# Patient Record
Sex: Male | Born: 1980 | Race: White | Hispanic: No | Marital: Married | Smoking: Never smoker
Health system: Southern US, Community
[De-identification: ages and names within clinical notes are randomized; demographics above are authoritative.]

## PROBLEM LIST (undated history)

## (undated) DIAGNOSIS — Z789 Other specified health status: Secondary | ICD-10-CM

## (undated) DIAGNOSIS — I861 Scrotal varices: Secondary | ICD-10-CM

## (undated) HISTORY — DX: Scrotal varices: I86.1

## (undated) HISTORY — DX: Other specified health status: Z78.9

---
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
## 2020-08-26 ENCOUNTER — Encounter: Payer: Self-pay | Admitting: Family Medicine

## 2020-08-26 ENCOUNTER — Ambulatory Visit (INDEPENDENT_AMBULATORY_CARE_PROVIDER_SITE_OTHER): Payer: Managed Care, Other (non HMO) | Admitting: Family Medicine

## 2020-08-26 ENCOUNTER — Other Ambulatory Visit: Payer: Self-pay

## 2020-08-26 VITALS — BP 108/73 | HR 50 | Temp 98.0°F | Ht 70.0 in | Wt 199.0 lb

## 2020-08-26 DIAGNOSIS — Z1159 Encounter for screening for other viral diseases: Secondary | ICD-10-CM | POA: Diagnosis not present

## 2020-08-26 DIAGNOSIS — E669 Obesity, unspecified: Secondary | ICD-10-CM | POA: Insufficient documentation

## 2020-08-26 DIAGNOSIS — Z23 Encounter for immunization: Secondary | ICD-10-CM

## 2020-08-26 DIAGNOSIS — H43393 Other vitreous opacities, bilateral: Secondary | ICD-10-CM

## 2020-08-26 DIAGNOSIS — Z114 Encounter for screening for human immunodeficiency virus [HIV]: Secondary | ICD-10-CM | POA: Diagnosis not present

## 2020-08-26 DIAGNOSIS — E663 Overweight: Secondary | ICD-10-CM

## 2020-08-26 DIAGNOSIS — L2084 Intrinsic (allergic) eczema: Secondary | ICD-10-CM | POA: Diagnosis not present

## 2020-08-26 DIAGNOSIS — M79671 Pain in right foot: Secondary | ICD-10-CM

## 2020-08-26 NOTE — Progress Notes (Signed)
New patient visit   Patient: Lance Bennett   DOB: 02/13/1981   39 y.o. Male  MRN: [id number] Visit Date: 08/26/2020  Today's healthcare provider: Shirlee Latch, MD   Chief Complaint  Patient presents with  . Rash  . Establish Care  . Foot Pain    Right foot a few days ago.  No injury.  Pt states it has resolved.   Subjective    Lance Bennett is a 39 y.o. male who presents today as a new patient to establish care.  HPI HPI    Foot Pain     Additional comments: Right foot a few days ago.  No injury.  Pt states it has resolved.       Last edited by Paschal Dopp, CMA on 08/26/2020  1:16 PM. (History)      Rash - intermittent x18 years. Itchy, always on R ankle.  Tried selsun blue.   Foot hurt for a few days and then self-resolved.  18 yrs ago, fell at work and ankle was crushed by a pipe. Electric pain. No swelling. Base of metatarsals. Pressure from shoes helped.  Floaters in vision - seem to have been there all his life.  No eye exam in 7 years. Couldn't have dilated eye exam.   Past Medical History:  Diagnosis Date  . No pertinent past medical history    Past Surgical History:  Procedure Laterality Date  . TONSILLECTOMY AND ADENOIDECTOMY  1996   Family Status  Relation Name Status  . Mother  Alive  . Father  Alive  . Sister  Alive  . Brother  Alive  . MGM  Alive  . MGF  Deceased  . PGM  Deceased  . PGF  Deceased  . Neg Hx  (Not Specified)   Family History  Problem Relation Age of Onset  . Diabetes Mother        diet-controlled  . Healthy Father   . Healthy Sister   . Heart defect Brother   . Diabetes Maternal Grandmother   . Breast cancer Maternal Grandmother   . Stroke Maternal Grandfather   . Diabetes Maternal Grandfather   . Transient ischemic attack Maternal Grandfather   . Prostate cancer Neg Hx   . Colon cancer Neg Hx    Social History   Socioeconomic History  . Marital status: Married    Spouse name: Not on file  . Number of  children: 3  . Years of education: Not on file  . Highest education level: Not on file  Occupational History  . 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 Smoking status: Never Smoker  . Smokeless tobacco: Never Used  Vaping Use  . Vaping Use: Never used  Substance and Sexual Activity  . Alcohol use: Yes    Comment: Rarely, social  . Drug use: Never  . Sexual activity: Yes    Partners: Female  Other Topics Concern  . Not on file  Social History Narrative  . Not on file   Social Determinants of Health   Financial Resource Strain:   . Difficulty of Paying Living Expenses: Not on file  Food Insecurity:   . Worried About Programme researcher, broadcasting/film/video in the Last Year: Not on file  . Ran Out of Food in the Last Year: Not on file  Transportation Needs:   . Lack of Transportation (Medical): Not on file  . Lack of Transportation (Non-Medical): Not on file  Physical Activity:   . Days of Exercise per Week: Not on file  . Minutes of Exercise per Session: Not on file  Stress:   . Feeling of Stress : Not on file  Social Connections:   . Frequency of Communication with Friends and Family: Not on file  . Frequency of Social Gatherings with Friends and Family: Not on file  . Attends Religious Services: Not on file  . Active Member of Clubs or Organizations: Not on file  . Attends BankerClub or Organization Meetings: Not on file  . Marital Status: Not on file   No outpatient medications prior to visit.   No facility-administered medications prior to visit.   No Known Allergies  Immunization History  Administered Date(s) Administered  . Influenza,inj,Quad PF,6+ Mos 08/26/2020    Health Maintenance  Topic Date Due  . COVID-19 Vaccine (1) Never done  . TETANUS/TDAP  Never done  . INFLUENZA VACCINE  Completed  . Hepatitis C Screening  Completed  . HIV Screening  Completed    Patient Care Team: Erasmo DownerBacigalupo, Kayon Dozier M, MD as PCP - General (Family  Medicine)  Review of Systems  Constitutional: Negative.   HENT: Negative.   Eyes: Negative.   Respiratory: Negative.   Cardiovascular: Negative.   Gastrointestinal: Negative.   Endocrine: Negative.   Genitourinary: Negative.   Musculoskeletal: Negative.   Skin: Negative.   Allergic/Immunologic: Negative.   Neurological: Negative.   Hematological: Negative.   Psychiatric/Behavioral: Negative.       Objective    BP 108/73 (BP Location: Right Arm, Patient Position: Sitting, Cuff Size: Large)   Pulse (!) 50   Temp 98 F (36.7 C) (Oral)   Ht 5\' 10"  (1.778 m)   Wt 199 lb (90.3 kg)   BMI 28.55 kg/m  Physical Exam Vitals reviewed.  Constitutional:      General: He is not in acute distress.    Appearance: Normal appearance. He is well-developed. He is not diaphoretic.  HENT:     Head: Normocephalic and atraumatic.     Right Ear: Tympanic membrane, ear canal and external ear normal.     Left Ear: Tympanic membrane, ear canal and external ear normal.  Eyes:     General: No scleral icterus.    Conjunctiva/sclera: Conjunctivae normal.     Pupils: Pupils are equal, round, and reactive to light.  Neck:     Thyroid: No thyromegaly.  Cardiovascular:     Rate and Rhythm: Normal rate and regular rhythm.     Pulses: Normal pulses.     Heart sounds: Normal heart sounds. No murmur heard.   Pulmonary:     Effort: Pulmonary effort is normal. No respiratory distress.     Breath sounds: Normal breath sounds. No wheezing or rales.  Abdominal:     General: There is no distension.     Palpations: Abdomen is soft.     Tenderness: There is no abdominal tenderness.  Musculoskeletal:        General: No swelling, tenderness or deformity.     Cervical back: Neck supple.     Right lower leg: No edema.     Left lower leg: No edema.  Lymphadenopathy:     Cervical: No cervical adenopathy.  Skin:    General: Skin is warm and dry.     Findings: Rash (small patch of eczema on posterior R  calf) present.  Neurological:     Mental Status: He is alert and oriented to person, place, and time. Mental  status is at baseline.     Gait: Gait normal.  Psychiatric:        Mood and Affect: Mood normal.        Behavior: Behavior normal.        Thought Content: Thought content normal.     Depression Screen PHQ 2/9 Scores 08/27/2020  PHQ - 2 Score 0   Results for orders placed or performed in visit on 08/26/20  Lipid panel  Result Value Ref Range   Cholesterol, Total 141 100 - 199 mg/dL   Triglycerides 161 0 - 149 mg/dL   HDL 28 (L) >09 mg/dL   VLDL Cholesterol Cal 23 5 - 40 mg/dL   LDL Chol Calc (NIH) 90 0 - 99 mg/dL   Chol/HDL Ratio 5.0 0.0 - 5.0 ratio  Comprehensive metabolic panel  Result Value Ref Range   Glucose 95 65 - 99 mg/dL   BUN 11 6 - 20 mg/dL   Creatinine, Ser 6.04 0.76 - 1.27 mg/dL   GFR calc non Af Amer 110 >59 mL/min/1.73   GFR calc Af Amer 127 >59 mL/min/1.73   BUN/Creatinine Ratio 13 9 - 20   Sodium 138 134 - 144 mmol/L   Potassium 4.0 3.5 - 5.2 mmol/L   Chloride 102 96 - 106 mmol/L   CO2 22 20 - 29 mmol/L   Calcium 9.1 8.7 - 10.2 mg/dL   Total Protein 7.5 6.0 - 8.5 g/dL   Albumin 4.2 4.0 - 5.0 g/dL   Globulin, Total 3.3 1.5 - 4.5 g/dL   Albumin/Globulin Ratio 1.3 1.2 - 2.2   Bilirubin Total 0.4 0.0 - 1.2 mg/dL   Alkaline Phosphatase 156 (H) 44 - 121 IU/L   AST 29 0 - 40 IU/L   ALT 42 0 - 44 IU/L  Hepatitis C Antibody  Result Value Ref Range   Hep C Virus Ab 0.3 0.0 - 0.9 s/co ratio  HIV antibody (with reflex)  Result Value Ref Range   HIV Screen 4th Generation wRfx Non Reactive Non Reactive    Assessment & Plan      Problem List Items Addressed This Visit      Musculoskeletal and Integument   Intrinsic eczema    New diagnosis He has been allergic to the additives/emollients and steroid creams previously, so we will try to avoid this Discussed moisturizing with an emollient such as Vaseline twice daily        Other   Overweight -  Primary    Exercises regularly Screening labs today Discussed importance of healthy weight management Discussed diet and exercise       Relevant Orders   Lipid panel (Completed)   Comprehensive metabolic panel (Completed)   Right foot pain    Short-lived and self-limited None present currently Normal exam today Possibility that this was reactive arthritis given that his brother-in-law had something similar recently Discussed return precautions      Vitreous floaters of both eyes    Ongoing for many years Discussed that this is common and usually worsens as we age He is young to have this so persistently and he was told by an optometrist once that he may have scarring on his retina I think that this warrants dilated eye exam with ophthalmology Referral placed today      Relevant Orders   Ambulatory referral to Ophthalmology    Other Visit Diagnoses    Need for hepatitis C screening test       Relevant Orders   Hepatitis  C Antibody (Completed)   Encounter for screening for HIV       Relevant Orders   HIV antibody (with reflex) (Completed)   Need for influenza vaccination       Relevant Orders   Flu Vaccine QUAD 6+ mos PF IM (Fluarix Quad PF) (Completed)       Return in about 1 year (around 08/26/2021) for CPE.     Total time spent on today's visit was greater than 30 minutes, including both face-to-face time and nonface-to-face time personally spent on review of chart (labs and imaging), discussing labs and goals, discussing further work-up, treatment options, referrals to specialist if needed, reviewing outside records of pertinent, answering patient's questions, and coordinating care.   I, Shirlee Latch, MD, have reviewed all documentation for this visit. The documentation on 08/27/20 for the exam, diagnosis, procedures, and orders are all accurate and complete.   Latora Quarry, Marzella Schlein, MD, MPH [facility]

## 2020-08-26 NOTE — Patient Instructions (Signed)

## 2020-08-27 DIAGNOSIS — H43393 Other vitreous opacities, bilateral: Secondary | ICD-10-CM | POA: Insufficient documentation

## 2020-08-27 DIAGNOSIS — M79671 Pain in right foot: Secondary | ICD-10-CM | POA: Insufficient documentation

## 2020-08-27 LAB — HEPATITIS C ANTIBODY: Hep C Virus Ab: 0.3 s/co ratio (ref 0.0–0.9)

## 2020-08-27 LAB — COMPREHENSIVE METABOLIC PANEL
ALT: 42 IU/L (ref 0–44)
AST: 29 IU/L (ref 0–40)
Albumin/Globulin Ratio: 1.3 (ref 1.2–2.2)
Albumin: 4.2 g/dL (ref 4.0–5.0)
Alkaline Phosphatase: 156 IU/L — ABNORMAL HIGH (ref 44–121)
BUN/Creatinine Ratio: 13 (ref 9–20)
BUN: 11 mg/dL (ref 6–20)
Bilirubin Total: 0.4 mg/dL (ref 0.0–1.2)
CO2: 22 mmol/L (ref 20–29)
Calcium: 9.1 mg/dL (ref 8.7–10.2)
Chloride: 102 mmol/L (ref 96–106)
Creatinine, Ser: 0.88 mg/dL (ref 0.76–1.27)
GFR calc Af Amer: 127 mL/min/{1.73_m2} (ref >59)
GFR calc non Af Amer: 110 mL/min/{1.73_m2} (ref >59)
Globulin, Total: 3.3 g/dL (ref 1.5–4.5)
Glucose: 95 mg/dL (ref 65–99)
Potassium: 4 mmol/L (ref 3.5–5.2)
Sodium: 138 mmol/L (ref 134–144)
Total Protein: 7.5 g/dL (ref 6.0–8.5)

## 2020-08-27 LAB — LIPID PANEL
Chol/HDL Ratio: 5 ratio (ref 0.0–5.0)
Cholesterol, Total: 141 mg/dL (ref 100–199)
HDL: 28 mg/dL — ABNORMAL LOW (ref >39)
LDL Chol Calc (NIH): 90 mg/dL (ref 0–99)
Triglycerides: 125 mg/dL (ref 0–149)
VLDL Cholesterol Cal: 23 mg/dL (ref 5–40)

## 2020-08-27 LAB — HIV ANTIBODY (ROUTINE TESTING W REFLEX): HIV Screen 4th Generation wRfx: NONREACTIVE

## 2020-08-27 NOTE — Assessment & Plan Note (Signed)
New diagnosis He has been allergic to the additives/emollients and steroid creams previously, so we will try to avoid this Discussed moisturizing with an emollient such as Vaseline twice daily

## 2020-08-27 NOTE — Assessment & Plan Note (Signed)
Short-lived and self-limited None present currently Normal exam today Possibility that this was reactive arthritis given that his brother-in-law had something similar recently Discussed return precautions

## 2020-08-27 NOTE — Assessment & Plan Note (Signed)
Ongoing for many years Discussed that this is common and usually worsens as we age He is young to have this so persistently and he was told by an optometrist once that he may have scarring on his retina I think that this warrants dilated eye exam with ophthalmology Referral placed today

## 2020-08-27 NOTE — Assessment & Plan Note (Signed)
Exercises regularly Screening labs today Discussed importance of healthy weight management Discussed diet and exercise

## 2020-11-19 ENCOUNTER — Telehealth: Payer: Self-pay

## 2020-11-19 DIAGNOSIS — R748 Abnormal levels of other serum enzymes: Secondary | ICD-10-CM

## 2020-11-19 NOTE — Telephone Encounter (Signed)
Copied from CRM [id number]. Topic: General - Other >> Nov 19, 2020  9:16 AM Gwenlyn Fudge wrote: Reason for CRM: Pt calling stating that he is needing to have repeat labs for cholesterol and alkaline phosphates. Pt is requesting to have lab orders placed. Pt is also requesting to have his booster shot. Please advise.

## 2020-11-20 ENCOUNTER — Telehealth: Payer: Self-pay

## 2020-11-20 NOTE — Telephone Encounter (Signed)
Patient is having his booster on 2/2 and would also like to get labs done at the same time if possible.

## 2020-11-20 NOTE — Addendum Note (Signed)
Addended by: Hyacinth Meeker on: 11/20/2020 09:12 AM   Modules accepted: Orders

## 2020-11-20 NOTE — Telephone Encounter (Signed)
LFT lab ordered. Patient will call back to schedule for COVID Booster Vaccine. Cholesterol does not need to be rechecked, it is too soon to be re checked.

## 2020-11-20 NOTE — Telephone Encounter (Signed)
That's fine. Looks like LFTs were already ordered, so he can get those at anytime.

## 2020-11-22 ENCOUNTER — Emergency Department
Admission: EM | Admit: 2020-11-22 | Discharge: 2020-11-22 | Disposition: A | Discharge status: Home / Self Care | Payer: Managed Care, Other (non HMO) | Attending: Emergency Medicine | Admitting: Emergency Medicine

## 2020-11-22 ENCOUNTER — Other Ambulatory Visit: Payer: Self-pay

## 2020-11-22 ENCOUNTER — Encounter: Payer: Self-pay | Admitting: Emergency Medicine

## 2020-11-22 DIAGNOSIS — S4992XA Unspecified injury of left shoulder and upper arm, initial encounter: Secondary | ICD-10-CM | POA: Diagnosis present

## 2020-11-22 DIAGNOSIS — W260XXA Contact with knife, initial encounter: Secondary | ICD-10-CM | POA: Insufficient documentation

## 2020-11-22 DIAGNOSIS — Z23 Encounter for immunization: Secondary | ICD-10-CM | POA: Diagnosis not present

## 2020-11-22 DIAGNOSIS — S61412A Laceration without foreign body of left hand, initial encounter: Secondary | ICD-10-CM

## 2020-11-22 MED ORDER — LIDOCAINE HCL (PF) 1 % IJ SOLN
5.0000 mL | Freq: Once | INTRAMUSCULAR | Status: AC
  Administered 2020-11-22: 5 mL
  Filled 2020-11-22: qty 5

## 2020-11-22 MED ORDER — TETANUS-DIPHTH-ACELL PERTUSSIS 5-2.5-18.5 LF-MCG/0.5 IM SUSY
0.5000 mL | PREFILLED_SYRINGE | Freq: Once | INTRAMUSCULAR | Status: AC
  Administered 2020-11-22: 0.5 mL via INTRAMUSCULAR
  Filled 2020-11-22: qty 0.5

## 2020-11-22 MED ORDER — TRAMADOL HCL 50 MG PO TABS
50.0000 mg | ORAL_TABLET | Freq: Four times a day (QID) | ORAL | 0 refills | Status: DC | PRN
Start: 2020-11-22 — End: 2021-05-12

## 2020-11-22 MED ORDER — IBUPROFEN 800 MG PO TABS
800.0000 mg | ORAL_TABLET | Freq: Once | ORAL | Status: AC
  Administered 2020-11-22: 800 mg via ORAL
  Filled 2020-11-22: qty 1

## 2020-11-22 MED ORDER — BACITRACIN-NEOMYCIN-POLYMYXIN 400-5-5000 EX OINT
TOPICAL_OINTMENT | Freq: Once | CUTANEOUS | Status: AC
  Filled 2020-11-22: qty 1

## 2020-11-22 NOTE — ED Provider Notes (Signed)
[facility] Emergency Department Provider Note   ____________________________________________   Event Date/Time   First MD Initiated Contact with Patient 11/22/20 1201     (approximate)  I have reviewed the triage vital signs and the nursing notes.   HISTORY  Chief Complaint Laceration    HPI Lance Bennett is a 40 y.o. male patient presents for laceration to the left thenar fossa which occurred 1 hour prior to arrival.  This was accidental knife cut self-inflicted.  Patient bleeds controlled direct pressure.  Patient has loss sensation of function.  Patient is right-hand dominant.  Patient denies recent travel or known contact with COVID-19.  Patient state last tetanus shot was 8 to 10 years ago.         Past Medical History:  Diagnosis Date  . No pertinent past medical history     Patient Active Problem List   Diagnosis Date Noted  . Right foot pain 08/27/2020  . Vitreous floaters of both eyes 08/27/2020  . Intrinsic eczema 08/26/2020  . Overweight 08/26/2020    Past Surgical History:  Procedure Laterality Date  . TONSILLECTOMY AND ADENOIDECTOMY  1996    Prior to Admission medications   Medication Sig Start Date End Date Taking? Authorizing Provider  traMADol (ULTRAM) 50 MG tablet Take 1 tablet (50 mg total) by mouth every 6 (six) hours as needed for moderate pain. 11/22/20  Yes Joni Reining, PA-C    Allergies Patient has no known allergies.  Family History  Problem Relation Age of Onset  . Diabetes Mother        diet-controlled  . Healthy Father   . Healthy Sister   . Heart defect Brother   . Diabetes Maternal Grandmother   . Breast cancer Maternal Grandmother   . Stroke Maternal Grandfather   . Diabetes Maternal Grandfather   . Transient ischemic attack Maternal Grandfather   . Prostate cancer Neg Hx   . Colon cancer Neg Hx     Social History Social History   Tobacco Use  . Smoking status: Never Smoker  .  Smokeless tobacco: Never Used  Vaping Use  . Vaping Use: Never used  Substance Use Topics  . Alcohol use: Yes    Comment: Rarely, social  . Drug use: Never    Review of Systems Constitutional: No fever/chills Eyes: No visual changes. ENT: No sore throat. Cardiovascular: Denies chest pain. Respiratory: Denies shortness of breath. Gastrointestinal: No abdominal pain.  No nausea, no vomiting.  No diarrhea.  No constipation. Genitourinary: Negative for dysuria. Musculoskeletal: Negative for back pain. Skin: Negative for rash.  Laceration left hand. Neurological: Negative for headaches, focal weakness or numbness. Endocrine:  Diabetes.   ____________________________________________   PHYSICAL EXAM:  VITAL SIGNS: ED Triage Vitals  Enc Vitals Group     BP 11/22/20 1133 137/85     Pulse Rate 11/22/20 1133 60     Resp 11/22/20 1133 16     Temp 11/22/20 1136 98.6 F (37 C)     Temp Source 11/22/20 1136 Oral     SpO2 11/22/20 1133 98 %     Weight 11/22/20 1134 205 lb (93 kg)     Height 11/22/20 1134 5\' 9"  (1.753 m)     Head Circumference --      Peak Flow --      Pain Score 11/22/20 1134 3     Pain Loc --      Pain Edu? --      Excl.  in GC? --    Constitutional: Alert and oriented. Well appearing and in no acute distress. Cardiovascular: Normal rate, regular rhythm. Grossly normal heart sounds.  Good peripheral circulation. Respiratory: Normal respiratory effort.  No retractions. Lungs CTAB.  Neurologic:  Normal speech and language. No gross focal neurologic deficits are appreciated. No gait instability. Skin: 0.5 cm laceration left thenar fossa.  Psychiatric: Mood and affect are normal. Speech and behavior are normal.  ____________________________________________   LABS (all labs ordered are listed, but only abnormal results are displayed)  Labs Reviewed - No data to  display ____________________________________________  EKG   ____________________________________________  RADIOLOGY I, Joni Reining, personally viewed and evaluated these images (plain radiographs) as part of my medical decision making, as well as reviewing the written report by the radiologist.  ED MD interpretation:    Official radiology report(s): No results found.  ____________________________________________   PROCEDURES  Procedure(s) performed (including Critical Care):  Marland KitchenMarland KitchenLaceration Repair  Date/Time: 11/22/2020 12:41 PM Performed by: Joni Reining, PA-C Authorized by: Joni Reining, PA-C   Consent:    Consent obtained:  Verbal   Consent given by:  Patient   Risks, benefits, and alternatives were discussed: yes     Risks discussed:  Infection, pain, poor cosmetic result, need for additional repair and poor wound healing Universal protocol:    Procedure explained and questions answered to patient or proxy's satisfaction: yes     Relevant documents present and verified: yes     Immediately prior to procedure, a time out was called: yes     Patient identity confirmed:  Verbally with patient Anesthesia:    Anesthesia method:  Nerve block   Block needle gauge:  25 G   Block anesthetic:  Lidocaine 1% w/o epi   Block injection procedure:  Anatomic landmarks identified, incremental injection and anatomic landmarks palpated   Block outcome:  Anesthesia achieved Laceration details:    Location:  Hand   Hand location:  L hand, dorsum   Length (cm):  0.3   Depth (mm):  2 Pre-procedure details:    Preparation:  Patient was prepped and draped in usual sterile fashion Exploration:    Limited defect created (wound extended): no     Hemostasis achieved with:  Direct pressure   Contaminated: no   Treatment:    Area cleansed with:  Povidone-iodine and saline   Amount of cleaning:  Standard   Irrigation method:  Pressure wash and syringe   Debridement:  None    Undermining:  None   Scar revision: no   Skin repair:    Repair method:  Sutures   Suture size:  3-0   Suture material:  Nylon   Suture technique:  Simple interrupted   Number of sutures:  4 Approximation:    Approximation:  Close Repair type:    Repair type:  Simple Post-procedure details:    Dressing:  Antibiotic ointment and sterile dressing   Procedure completion:  Tolerated well, no immediate complications     ____________________________________________   INITIAL IMPRESSION / ASSESSMENT AND PLAN / ED COURSE  As part of my medical decision making, I reviewed the following data within the electronic MEDICAL RECORD NUMBER         Patient presents with laceration to left thenar fossa.  See procedure note for wound closure.  Patient given discharge care instructions.  Patient advised to have sutures removed in 10 days by PCP at this facility.      ____________________________________________  FINAL CLINICAL IMPRESSION(S) / ED DIAGNOSES  Final diagnoses:  Laceration of left hand without foreign body, initial encounter     ED Discharge Orders         Ordered    traMADol (ULTRAM) 50 MG tablet  Every 6 hours PRN        11/22/20 1229          *Please note:  Rajah Tagliaferro was evaluated in Emergency Department on 11/22/2020 for the symptoms described in the history of present illness. He was evaluated in the context of the global COVID-19 pandemic, which necessitated consideration that the patient might be at risk for infection with the SARS-CoV-2 virus that causes COVID-19. Institutional protocols and algorithms that pertain to the evaluation of patients at risk for COVID-19 are in a state of rapid change based on information released by regulatory bodies including the CDC and federal and state organizations. These policies and algorithms were followed during the patient's care in the ED.  Some ED evaluations and interventions may be delayed as a result of limited  staffing during and the pandemic.*   Note:  This document was prepared using Dragon voice recognition software and may include unintentional dictation errors.    Joni Reining, PA-C 11/22/20 1243    Shaune Pollack, MD 11/23/20 2041

## 2020-11-22 NOTE — ED Notes (Signed)
See triage note  Present with small laceration to web space to left hand  States he stab himself with knife by accident

## 2020-11-22 NOTE — ED Triage Notes (Signed)
Pt to ED via POV, pt states that about 1 hour PTA he accidentally cut himself with his pocket knife. Pt states that laceration appears to be deep. Bleeding is controlled at this time. Pt is in NAD.

## 2020-11-22 NOTE — Discharge Instructions (Addendum)
All discharge care instructions and have sutures removed in 10 days by this facility or your PCP.

## 2020-11-26 ENCOUNTER — Ambulatory Visit (INDEPENDENT_AMBULATORY_CARE_PROVIDER_SITE_OTHER): Payer: Managed Care, Other (non HMO)

## 2020-11-26 ENCOUNTER — Other Ambulatory Visit: Payer: Self-pay

## 2020-11-26 DIAGNOSIS — Z23 Encounter for immunization: Secondary | ICD-10-CM

## 2020-12-02 ENCOUNTER — Other Ambulatory Visit: Payer: Self-pay

## 2020-12-02 ENCOUNTER — Ambulatory Visit (INDEPENDENT_AMBULATORY_CARE_PROVIDER_SITE_OTHER): Payer: Managed Care, Other (non HMO) | Admitting: Physician Assistant

## 2020-12-02 ENCOUNTER — Encounter: Payer: Self-pay | Admitting: Physician Assistant

## 2020-12-02 VITALS — BP 112/76 | HR 65 | Temp 98.5°F | Wt 202.9 lb

## 2020-12-02 DIAGNOSIS — Z4802 Encounter for removal of sutures: Secondary | ICD-10-CM

## 2020-12-02 NOTE — Progress Notes (Signed)
Established patient visit   Patient: Lance Bennett   DOB: Oct 07, 1981   40 y.o. Male  MRN: [id number] Visit Date: 12/02/2020  Today's healthcare provider: Trey Sailors, PA-C   Chief Complaint  Patient presents with  . Suture / Staple Removal  I,Porsha C McClurkin,acting as a scribe for Trey Sailors, PA-C.,have documented all relevant documentation on the behalf of Trey Sailors, PA-C,as directed by  Trey Sailors, PA-C while in the presence of Trey Sailors, PA-C.  Subjective    Suture / Staple Removal The sutures were placed 7 to 10 days ago. He tried oral antibiotics since the wound repair. The treatment provided moderate relief. His temperature was unmeasured prior to arrival. There has been no drainage from the wound. The redness has improved. There is no swelling present. There is no pain present. He has no difficulty moving the affected extremity or digit.    He had seen an MD live after his visit with the ER who thought he might have an infection and was subsequently started on abx. He is taking these.   He is also due for repeat liver enzymes due to elevation. He is concerned the antibiotics might interfere with the testing.      Medications: Outpatient Medications Prior to Visit  Medication Sig  . cephALEXin (KEFLEX) 500 MG capsule Take 500 mg by mouth 3 (three) times daily.  . traMADol (ULTRAM) 50 MG tablet Take 1 tablet (50 mg total) by mouth every 6 (six) hours as needed for moderate pain. (Patient not taking: Reported on 12/02/2020)   No facility-administered medications prior to visit.    Review of Systems  Constitutional: Negative.   Respiratory: Negative.   Hematological: Negative.   Psychiatric/Behavioral: Negative.        Objective    BP 112/76 (BP Location: Left Arm, Patient Position: Sitting, Cuff Size: Large)   Pulse 65   Temp 98.5 F (36.9 C) (Oral)   Wt 202 lb 14.4 oz (92 kg)   SpO2 100%   BMI 29.96 kg/m     Physical  Exam Constitutional:      Appearance: Normal appearance.  Cardiovascular:     Rate and Rhythm: Normal rate.  Pulmonary:     Effort: Pulmonary effort is normal.  Musculoskeletal:       Arms:     Comments: 4 simple interrupted sutures in dorsal web of left hand.   Skin:    General: Skin is warm and dry.  Neurological:     General: No focal deficit present.     Mental Status: He is alert and oriented to person, place, and time.  Psychiatric:        Mood and Affect: Mood normal.        Behavior: Behavior normal.       No results found for any visits on 12/02/20.  Assessment & Plan    1. Visit for suture removal  4 simple interrupted sutures removed without issue. Wound healing well, may stop antibiotics. Counseled antibiotics will not interfere with his lab testing but if it is to his comfort to wait, he certainly may. He would like to wait and return for follow up lab work.    No follow-ups on file.      ITrey Sailors, PA-C, have reviewed all documentation for this visit. The documentation on 12/03/20 for the exam, diagnosis, procedures, and orders are all accurate and complete.  The entirety of the information  documented in the History of Present Illness, Review of Systems and Physical Exam were personally obtained by me. Portions of this information were initially documented by [facility] and reviewed by me for thoroughness and accuracy.     Paulene Floor  [facility] [phone] (phone) [phone] (fax)  [place]

## 2020-12-02 NOTE — Patient Instructions (Signed)
Suture Removal, Care After This sheet gives you information about how to care for yourself after your procedure. Your health care provider may also give you more specific instructions. If you have problems or questions, contact your health care provider. What can I expect after the procedure? After your stitches (sutures) are removed, it is common to have:  Some discomfort and swelling in the area.  Slight redness in the area. Follow these instructions at home: If you have a bandage:  Wash your hands with soap and water before you change your bandage (dressing). If soap and water are not available, use hand sanitizer.  Change your dressing as told by your health care provider. If your dressing becomes wet or dirty, or develops a bad smell, change it as soon as possible.  If your dressing sticks to your skin, soak it in warm water to loosen it. Wound care  Check your wound every day for signs of infection. Check for: ? More redness, swelling, or pain. ? Fluid or blood. ? Warmth. ? Pus or a bad smell.  Wash your hands with soap and water before and after touching your wound.  Apply cream or ointment only as directed by your health care provider. If you are using cream or ointment, wash the area with soap and water 2 times a day to remove all the cream or ointment. Rinse off the soap and pat the area dry with a clean towel.  If you have skin glue or adhesive strips on your wound, leave these closures in place. They may need to stay in place for 2 weeks or longer. If adhesive strip edges start to loosen and curl up, you may trim the loose edges. Do not remove adhesive strips completely unless your health care provider tells you to do that.  Keep the wound area dry and clean. Do not take baths, swim, or use a hot tub until your health care provider approves.  Continue to protect the wound from injury.  Do not pick at your wound. Picking can cause an infection.  When your wound has  completely healed, wear sunscreen over it or cover it with clothing when you are outside. New scars get sunburned easily, which can make scarring worse.   General instructions  Take over-the-counter and prescription medicines only as told by your health care provider.  Keep all follow-up visits as told by your health care provider. This is important. Contact a health care provider if:  You have redness, swelling, or pain around your wound.  You have fluid or blood coming from your wound.  Your wound feels warm to the touch.  You have pus or a bad smell coming from your wound.  Your wound opens up. Get help right away if:  You have a fever.  You have redness that is spreading from your wound. Summary  After your sutures are removed, it is common to have some discomfort and swelling in the area.  Wash your hands with soap and water before you change your bandage (dressing).  Keep the wound area dry and clean. Do not take baths, swim, or use a hot tub until your health care provider approves. This information is not intended to replace advice given to you by your health care provider. Make sure you discuss any questions you have with your health care provider. Document Revised: 08/08/2020 Document Reviewed: 08/08/2020 Elsevier Patient Education  2021 Elsevier Inc.  

## 2020-12-20 LAB — HEPATIC FUNCTION PANEL
ALT: 33 IU/L (ref 0–44)
AST: 27 IU/L (ref 0–40)
Albumin: 4.3 g/dL (ref 4.0–5.0)
Alkaline Phosphatase: 148 IU/L — ABNORMAL HIGH (ref 44–121)
Bilirubin Total: 0.4 mg/dL (ref 0.0–1.2)
Bilirubin, Direct: 0.12 mg/dL (ref 0.00–0.40)
Total Protein: 7.3 g/dL (ref 6.0–8.5)

## 2020-12-25 ENCOUNTER — Encounter: Payer: Self-pay | Admitting: Family Medicine

## 2020-12-26 NOTE — Telephone Encounter (Signed)
Maybe he wants a f/u appt to discuss these new problem

## 2021-02-17 ENCOUNTER — Telehealth: Payer: Self-pay

## 2021-02-17 DIAGNOSIS — R748 Abnormal levels of other serum enzymes: Secondary | ICD-10-CM

## 2021-02-17 NOTE — Telephone Encounter (Signed)
Patient advised as below.  

## 2021-02-17 NOTE — Telephone Encounter (Signed)
Copied from [place] [id number]. Topic: General - Other >> Feb 17, 2021  1:08 PM Pawlus, Brayton Layman A wrote: Reason for CRM: Pt stated he was supposed to follow up around 30 days to re-check his blood panel, pt wanted to know if the orders could be placed. Please advise. >> Feb 17, 2021  1:13 PM Pawlus, Brayton Layman A wrote: It was noted (12/23/20) to repeat LFTs and fractionated Alk phos in 1 month.

## 2021-02-17 NOTE — Addendum Note (Signed)
Addended by: Hyacinth Meeker on: 02/17/2021 02:36 PM   Modules accepted: Orders

## 2021-02-18 ENCOUNTER — Encounter: Payer: Self-pay | Admitting: Family Medicine

## 2021-02-20 ENCOUNTER — Encounter: Payer: Self-pay | Admitting: Family Medicine

## 2021-02-21 LAB — HEPATIC FUNCTION PANEL
ALT: 37 IU/L (ref 0–44)
AST: 29 IU/L (ref 0–40)
Albumin: 4.1 g/dL (ref 4.0–5.0)
Alkaline Phosphatase: 145 IU/L — ABNORMAL HIGH (ref 44–121)
Bilirubin Total: 0.4 mg/dL (ref 0.0–1.2)
Bilirubin, Direct: <0.1 mg/dL (ref 0.00–0.40)
Total Protein: 6.9 g/dL (ref 6.0–8.5)

## 2021-02-21 LAB — ALKALINE PHOSPHATASE, ISOENZYMES
BONE FRACTION: 27 % (ref 12–68)
INTESTINAL FRAC.: 3 % (ref 0–18)
LIVER FRACTION: 70 % (ref 13–88)

## 2021-02-23 ENCOUNTER — Telehealth: Payer: Self-pay

## 2021-02-23 DIAGNOSIS — R748 Abnormal levels of other serum enzymes: Secondary | ICD-10-CM

## 2021-02-23 NOTE — Telephone Encounter (Signed)
-----   Message from Erasmo Downer, MD sent at 02/23/2021  8:19 AM EDT ----- Ok fractionation has resulted. Let's get RUQ [place] and send to GI for referral.

## 2021-02-23 NOTE — Telephone Encounter (Signed)
Lance Crews, MD  02/23/2021 8:19 AM EDT Back to Top     Ok fractionation has resulted. Let's get RUQ [place] and send to GI for referral.   Lance Crews, MD  02/20/2021 9:19 AM EDT      Alk phos remains about the same. I am waiting on the fractionation.

## 2021-02-24 ENCOUNTER — Encounter: Payer: Self-pay | Admitting: *Deleted

## 2021-02-26 ENCOUNTER — Other Ambulatory Visit: Payer: Self-pay

## 2021-02-26 ENCOUNTER — Ambulatory Visit (HOSPITAL_BASED_OUTPATIENT_CLINIC_OR_DEPARTMENT_OTHER)
Admission: RE | Admit: 2021-02-26 | Discharge: 2021-02-26 | Disposition: A | Discharge status: Home / Self Care | Payer: Managed Care, Other (non HMO) | Source: Ambulatory Visit | Attending: Family Medicine | Admitting: Family Medicine

## 2021-02-26 DIAGNOSIS — R748 Abnormal levels of other serum enzymes: Secondary | ICD-10-CM | POA: Diagnosis present

## 2021-05-12 ENCOUNTER — Ambulatory Visit (INDEPENDENT_AMBULATORY_CARE_PROVIDER_SITE_OTHER): Payer: Managed Care, Other (non HMO) | Admitting: Gastroenterology

## 2021-05-12 ENCOUNTER — Encounter: Payer: Self-pay | Admitting: Gastroenterology

## 2021-05-12 ENCOUNTER — Other Ambulatory Visit: Payer: Self-pay

## 2021-05-12 VITALS — BP 132/72 | HR 67 | Temp 98.0°F | Ht 70.0 in | Wt 203.8 lb

## 2021-05-12 DIAGNOSIS — R748 Abnormal levels of other serum enzymes: Secondary | ICD-10-CM | POA: Diagnosis not present

## 2021-05-12 NOTE — Progress Notes (Signed)
Wyline Mood MD, MRCP(U.K) [address]  [address]  [place], [place] [address]  Main: [phone]  Fax: [phone]   Gastroenterology Consultation  Referring Provider:     Erasmo Downer, MD Primary Care Physician:  Erasmo Downer, MD Primary Gastroenterologist:  Dr. Wyline Mood  Reason for Consultation:     Elevated alkaline phosphatase        HPI:   Lance Bennett is a 40 y.o. y/o male referred for consultation & management  by Dr. Beryle Flock, Marzella Schlein, MD.    He has been referred for an elevated alkaline phosphatase level.  Labs in February 2022 demonstrate an alkaline phosphatase of 148, hepatitis C virus antibody was negative in November 2021 as well as HIV.  Fractionated alkaline phosphatase was normal.  He underwent a right upper quadrant ultrasound that showed no abnormalities.  He denies any symptoms, no family history of liver disease, no excess alcohol consumption.  No herbal supplements . no excess Tylenol consumption .  Denies any [employer], illegal drug use or tattoos.  No symptoms of fatigue itching.  Past Medical History:  Diagnosis Date   No pertinent past medical history     Past Surgical History:  Procedure Laterality Date   TONSILLECTOMY AND ADENOIDECTOMY  1996    Prior to Admission medications   Medication Sig Start Date End Date Taking? Authorizing Provider  cephALEXin (KEFLEX) 500 MG capsule Take 500 mg by mouth 3 (three) times daily. 11/29/20   [provider]  traMADol (ULTRAM) 50 MG tablet Take 1 tablet (50 mg total) by mouth every 6 (six) hours as needed for moderate pain. Patient not taking: Reported on 12/02/2020 11/22/20   Joni Reining, PA-C    Family History  Problem Relation Age of Onset   Diabetes Mother        diet-controlled   Healthy Father    Healthy Sister    Heart defect Brother    Diabetes Maternal Grandmother    Breast cancer Maternal Grandmother    Stroke Maternal Grandfather    Diabetes  Maternal Grandfather    Transient ischemic attack Maternal Grandfather    Prostate cancer Neg Hx    Colon cancer Neg Hx      Social History   Tobacco Use   Smoking status: Never   Smokeless tobacco: Never  Vaping Use   Vaping Use: Never used  Substance Use Topics   Alcohol use: Yes    Comment: Rarely, social   Drug use: Never    Allergies as of 05/12/2021   (No Known Allergies)    Review of Systems:    All systems reviewed and negative except where noted in HPI.   Physical Exam:  BP 132/72   Pulse 67   Temp 98 F (36.7 C) (Oral)   Ht 5\' 10"  (1.778 m)   Wt 203 lb 12.8 oz (92.4 kg)   BMI 29.24 kg/m  No LMP for male patient. Psych:  Alert and cooperative. Normal mood and affect. General:   Alert,  Well-developed, well-nourished, pleasant and cooperative in NAD Abdomen:  Normal bowel sounds.  No bruits.  Soft, non-tender and non-distended without masses, hepatosplenomegaly or hernias noted.  No guarding or rebound tenderness.    Neurologic:  Alert and oriented x3;  grossly normal neurologically. Psych:  Alert and cooperative. Normal mood and affect.  Imaging Studies: No results found.  Assessment and Plan:   Lance Bennett is a 40 y.o. y/o male has been referred  for an isolated elevated alkaline phosphatase level.  Going on for about 8 months.  Right upper quadrant ultrasound was normal.  Fractionated alkaline phosphatase levels are within normal limits.  I will perform a complete autoimmune work-up we will also rule out other causes for elevated alkaline phosphatase such as low vitamin D, calcium and hyperparathyroidism.  I will discuss results with him in a few weeks time.  Follow up in 4 weeks video visit  Dr Wyline Mood MD,MRCP(U.K)

## 2021-05-20 ENCOUNTER — Telehealth: Payer: Self-pay

## 2021-05-20 NOTE — Telephone Encounter (Signed)
Called patient but had to leave a voicemail letting him know that he needs to call us back so we could schedule him an EGD.

## 2021-05-20 NOTE — Telephone Encounter (Signed)
-----   Message from Wyline Mood, MD sent at 05/20/2021  9:51 AM EDT ----- Kandis Cocking plesae Inform celiac blood test is positive- needs EGD to confirm or rule out celiac disease   C/c Bacigalupo, Marzella Schlein, MD    Dr Wyline Mood MD,MRCP [facility]) Gastroenterology/Hepatology Pager: [phone]

## 2021-05-21 LAB — IMMUNOGLOBULINS A/E/G/M, SERUM
IgA/Immunoglobulin A, Serum: 142 mg/dL (ref 90–386)
IgE (Immunoglobulin E), Serum: 79 IU/mL (ref 6–495)
IgG (Immunoglobin G), Serum: 1547 mg/dL (ref 603–1613)
IgM (Immunoglobulin M), Srm: 34 mg/dL (ref 20–172)

## 2021-05-21 LAB — HIV ANTIBODY (ROUTINE TESTING W REFLEX): HIV Screen 4th Generation wRfx: NONREACTIVE

## 2021-05-21 LAB — ALPHA-1-ANTITRYPSIN: A-1 Antitrypsin: 142 mg/dL (ref 95–164)

## 2021-05-21 LAB — IRON,TIBC AND FERRITIN PANEL
Ferritin: 38 ng/mL (ref 30–400)
Iron Saturation: 13 % — ABNORMAL LOW (ref 15–55)
Iron: 45 ug/dL (ref 38–169)
Total Iron Binding Capacity: 338 ug/dL (ref 250–450)
UIBC: 293 ug/dL (ref 111–343)

## 2021-05-21 LAB — ENDOMYSIAL IGA ANTIBODY: Endomysial IgA: POSITIVE — AB

## 2021-05-21 LAB — CK: Total CK: 151 U/L (ref 49–439)

## 2021-05-21 LAB — CELIAC DISEASE AB SCREEN W/RFX
Antigliadin Abs, IgA: 16 units (ref 0–19)
Transglutaminase IgA: 57 U/mL — ABNORMAL HIGH (ref 0–3)

## 2021-05-21 LAB — ANTI-MICROSOMAL ANTIBODY LIVER / KIDNEY: LKM1 Ab: 7.2 Units (ref 0.0–20.0)

## 2021-05-21 LAB — TSH: TSH: 2.1 u[IU]/mL (ref 0.450–4.500)

## 2021-05-21 LAB — GAMMA GT: GGT: 22 IU/L (ref 0–65)

## 2021-05-21 LAB — PTH, INTACT AND CALCIUM
Calcium: 8.8 mg/dL (ref 8.7–10.2)
PTH: 43 pg/mL (ref 15–65)

## 2021-05-21 LAB — HEPATITIS B E ANTIGEN: Hep B E Ag: NEGATIVE

## 2021-05-21 LAB — HEPATITIS B SURFACE ANTIGEN: Hepatitis B Surface Ag: NEGATIVE

## 2021-05-21 LAB — HEPATITIS C ANTIBODY: Hep C Virus Ab: 0.1 s/co ratio (ref 0.0–0.9)

## 2021-05-21 LAB — MITOCHONDRIAL/SMOOTH MUSCLE AB PNL
Mitochondrial Ab: <20 Units (ref 0.0–20.0)
Smooth Muscle Ab: 14 Units (ref 0–19)

## 2021-05-21 LAB — HEPATITIS A ANTIBODY, TOTAL: hep A Total Ab: NEGATIVE

## 2021-05-21 LAB — ANA: Anti Nuclear Antibody (ANA): POSITIVE — AB

## 2021-05-21 LAB — HEPATITIS B E ANTIBODY: Hep B E Ab: NEGATIVE

## 2021-05-21 LAB — HEPATITIS B CORE ANTIBODY, TOTAL: Hep B Core Total Ab: NEGATIVE

## 2021-05-21 LAB — CERULOPLASMIN: Ceruloplasmin: 25.7 mg/dL (ref 16.0–31.0)

## 2021-05-21 LAB — HEPATITIS B SURFACE ANTIBODY,QUALITATIVE: Hep B Surface Ab, Qual: NONREACTIVE

## 2021-05-26 ENCOUNTER — Other Ambulatory Visit: Payer: Self-pay

## 2021-05-26 DIAGNOSIS — K909 Intestinal malabsorption, unspecified: Secondary | ICD-10-CM

## 2021-05-26 NOTE — Telephone Encounter (Signed)
Patient called back and he stated that he was available to have an EGD scheduled for this Friday 05/29/2021.I then called the endoscopy unit to make sure that he was able to be added on that day since it was already full and spoke to [place] and then she called me back to let me know that it was possible. I then called patient and told him that he would be able to be done on this Friday. I gave him his instructions and he had no further questions.

## 2021-05-29 ENCOUNTER — Encounter
Admission: RE | Disposition: A | Discharge status: Home / Self Care | Payer: Self-pay | Source: Home / Self Care | Attending: Gastroenterology

## 2021-05-29 ENCOUNTER — Ambulatory Visit: Payer: Managed Care, Other (non HMO) | Admitting: Certified Registered"

## 2021-05-29 ENCOUNTER — Ambulatory Visit
Admission: RE | Admit: 2021-05-29 | Discharge: 2021-05-29 | Disposition: A | Discharge status: Home / Self Care | Payer: Managed Care, Other (non HMO) | Attending: Gastroenterology | Admitting: Gastroenterology

## 2021-05-29 DIAGNOSIS — R894 Abnormal immunological findings in specimens from other organs, systems and tissues: Secondary | ICD-10-CM

## 2021-05-29 DIAGNOSIS — R768 Other specified abnormal immunological findings in serum: Secondary | ICD-10-CM | POA: Insufficient documentation

## 2021-05-29 DIAGNOSIS — K909 Intestinal malabsorption, unspecified: Secondary | ICD-10-CM

## 2021-05-29 HISTORY — PX: ESOPHAGOGASTRODUODENOSCOPY (EGD) WITH PROPOFOL: SHX5813

## 2021-05-29 SURGERY — ESOPHAGOGASTRODUODENOSCOPY (EGD) WITH PROPOFOL
Anesthesia: General

## 2021-05-29 MED ORDER — GLYCOPYRROLATE 0.2 MG/ML IJ SOLN
INTRAMUSCULAR | Status: DC | PRN
  Administered 2021-05-29: .2 mg via INTRAVENOUS

## 2021-05-29 MED ORDER — MIDAZOLAM HCL 5 MG/5ML IJ SOLN
INTRAMUSCULAR | Status: DC | PRN
  Administered 2021-05-29: 2 mg via INTRAVENOUS

## 2021-05-29 MED ORDER — GLYCOPYRROLATE 0.2 MG/ML IJ SOLN
INTRAMUSCULAR | Status: AC
  Filled 2021-05-29: qty 1

## 2021-05-29 MED ORDER — MIDAZOLAM HCL 2 MG/2ML IJ SOLN
INTRAMUSCULAR | Status: AC
  Filled 2021-05-29: qty 2

## 2021-05-29 MED ORDER — LIDOCAINE 2% (20 MG/ML) 5 ML SYRINGE
INTRAMUSCULAR | Status: DC | PRN
  Administered 2021-05-29: 25 mg via INTRAVENOUS

## 2021-05-29 MED ORDER — SODIUM CHLORIDE 0.9 % IV SOLN
INTRAVENOUS | Status: DC
  Administered 2021-05-29: 1000 mL via INTRAVENOUS

## 2021-05-29 MED ORDER — PROPOFOL 10 MG/ML IV BOLUS
INTRAVENOUS | Status: DC | PRN
  Administered 2021-05-29: 100 mg via INTRAVENOUS

## 2021-05-29 MED ORDER — PROPOFOL 500 MG/50ML IV EMUL
INTRAVENOUS | Status: DC | PRN
  Administered 2021-05-29: 120 ug/kg/min via INTRAVENOUS

## 2021-05-29 NOTE — Op Note (Signed)
[facility] Gastroenterology Patient Name: Lance Bennett Procedure Date: 05/29/2021 10:56 AM MRN: [id number] Account #: [id number] Date of Birth: [birth year] Admit Type: Outpatient Age: 40 Room: [facility] ENDO ROOM 4 Gender: Male Note Status: Finalized Procedure:             Upper GI endoscopy Indications:           Positive celiac serologies Providers:             Wyline Mood MD, MD Referring MD:          Marzella Schlein. Bacigalupo (Referring MD) Medicines:             Monitored Anesthesia Care Complications:         No immediate complications. Procedure:             Pre-Anesthesia Assessment:                        - Prior to the procedure, a History and Physical was                         performed, and patient medications, allergies and                         sensitivities were reviewed. The patient's tolerance                         of previous anesthesia was reviewed.                        - The risks and benefits of the procedure and the                         sedation options and risks were discussed with the                         patient. All questions were answered and informed                         consent was obtained.                        - ASA Grade Assessment: II - A patient with mild                         systemic disease.                        After obtaining informed consent, the endoscope was                         passed under direct vision. Throughout the procedure,                         the patient's blood pressure, pulse, and oxygen                         saturations were monitored continuously. The Endoscope                         was introduced through the mouth, and  advanced to the                         third part of duodenum. The upper GI endoscopy was                         accomplished with ease. The patient tolerated the                         procedure well. Findings:      The esophagus was normal.      The stomach was  normal.      The cardia and gastric fundus were normal on retroflexion.      The examined duodenum was normal. Biopsies for histology were taken with       a cold forceps for evaluation of celiac disease. Impression:            - Normal esophagus.                        - Normal stomach.                        - Normal examined duodenum. Biopsied. Recommendation:        - Discharge patient to home (with escort).                        - Resume previous diet.                        - Continue present medications.                        - Await pathology results.                        - Return to my office as previously scheduled. Procedure Code(s):     --- Professional ---                        [id number], Esophagogastroduodenoscopy, flexible,                         transoral; with biopsy, single or multiple Diagnosis Code(s):     --- Professional ---                        R76.8, Other specified abnormal immunological findings                         in serum CPT copyright 2019 American Medical Association. All rights reserved. The codes documented in this report are preliminary and upon coder review may  be revised to meet current compliance requirements. Wyline Mood, MD Wyline Mood MD, MD 05/29/2021 11:19:11 AM This report has been signed electronically. Number of Addenda: 0 Note Initiated On: 05/29/2021 10:56 AM Estimated Blood Loss:  Estimated blood loss: none.      [facility]

## 2021-05-29 NOTE — Transfer of Care (Signed)
Immediate Anesthesia Transfer of Care Note  Patient: Lance Bennett  Procedure(s) Performed: ESOPHAGOGASTRODUODENOSCOPY (EGD) WITH PROPOFOL  Patient Location: Endoscopy Unit  Anesthesia Type:General  Level of Consciousness: awake  Airway & Oxygen Therapy: Patient Spontanous Breathing  Post-op Assessment: Report given to RN and Post -op Vital signs reviewed and stable  Post vital signs: Reviewed  Last Vitals:  Vitals Value Taken Time  BP 115/66 05/29/21 1121  Temp    Pulse 71 05/29/21 1121  Resp    SpO2 95 % 05/29/21 1121  Vitals shown include unvalidated device data.  Last Pain:  Vitals:   05/29/21 1052  TempSrc: Temporal  PainSc: 0-No pain         Complications: No notable events documented.

## 2021-05-29 NOTE — H&P (Signed)
     Lance Mood, MD [address], [place], [place], [address] [address], [address], [place], [place], [address] Phone: [phone]  Fax: [phone]  Primary Care Physician:  Erasmo Downer, MD   Pre-Procedure History & Physical: HPI:  Lance Bennett is a 40 y.o. male is here for an endoscopy    Past Medical History:  Diagnosis Date   No pertinent past medical history     Past Surgical History:  Procedure Laterality Date   TONSILLECTOMY AND ADENOIDECTOMY  1996    Prior to Admission medications   Not on File    Allergies as of 05/26/2021   (No Known Allergies)    Family History  Problem Relation Age of Onset   Diabetes Mother        diet-controlled   Healthy Father    Healthy Sister    Heart defect Brother    Diabetes Maternal Grandmother    Breast cancer Maternal Grandmother    Stroke Maternal Grandfather    Diabetes Maternal Grandfather    Transient ischemic attack Maternal Grandfather    Prostate cancer Neg Hx    Colon cancer Neg Hx     Social History   Socioeconomic History   Marital status: Married    Spouse name: Not on file   Number of children: 3   Years of education: Not on file   Highest education level: Not on file  Occupational History   Occupation: Sport and exercise psychologist    Comment: [employer]  Tobacco Use   Smoking status: Never   Smokeless tobacco: Never  Vaping Use   Vaping Use: Never used  Substance and Sexual Activity   Alcohol use: Yes    Comment: Rarely, social   Drug use: Never   Sexual activity: Yes    Partners: Female  Other Topics Concern   Not on file  Social History Narrative   Not on file   Social Determinants of Health   Financial Resource Strain: Not on file  Food Insecurity: Not on file  Transportation Needs: Not on file  Physical Activity: Not on file  Stress: Not on file  Social Connections: Not on file  Intimate Partner Violence: Not on file    Review of Systems: See HPI,  otherwise negative ROS  Physical Exam: BP 131/88   Pulse (!) 57   Temp (!) 97.3 F (36.3 C) (Temporal)   Resp 18   Ht 5' 9.5" (1.765 m)   Wt 92.3 kg   SpO2 100%   BMI 29.61 kg/m  General:   Alert,  pleasant and cooperative in NAD Head:  Normocephalic and atraumatic. Neck:  Supple; no masses or thyromegaly. Lungs:  Clear throughout to auscultation, normal respiratory effort.    Heart:  +S1, +S2, Regular rate and rhythm, No edema. Abdomen:  Soft, nontender and nondistended. Normal bowel sounds, without guarding, and without rebound.   Neurologic:  Alert and  oriented x4;  grossly normal neurologically.  Impression/Plan: [name] is here for an endoscopy  to be performed for  evaluation of celiac disease    Risks, benefits, limitations, and alternatives regarding endoscopy have been reviewed with the patient.  Questions have been answered.  All parties agreeable.   Lance Mood, MD  05/29/2021, 10:59 AM

## 2021-05-29 NOTE — Anesthesia Postprocedure Evaluation (Signed)
Anesthesia Post Note  Patient: Lance Bennett  Procedure(s) Performed: ESOPHAGOGASTRODUODENOSCOPY (EGD) WITH PROPOFOL  Patient location during evaluation: Endoscopy Anesthesia Type: General Level of consciousness: awake and alert Pain management: pain level controlled Vital Signs Assessment: post-procedure vital signs reviewed and stable Respiratory status: spontaneous breathing, nonlabored ventilation, respiratory function stable and patient connected to nasal cannula oxygen Cardiovascular status: blood pressure returned to baseline and stable Postop Assessment: no apparent nausea or vomiting Anesthetic complications: no   No notable events documented.   Last Vitals:  Vitals:   05/29/21 1130 05/29/21 1140  BP: 110/74 (!) 115/93  Pulse: 75 64  Resp: (!) 26 17  Temp:    SpO2: 92% 97%    Last Pain:  Vitals:   05/29/21 1120  TempSrc: Temporal  PainSc:                  Corinda Gubler

## 2021-05-29 NOTE — Anesthesia Preprocedure Evaluation (Signed)
Anesthesia Evaluation  Patient identified by MRN, date of birth, ID band Patient awake    Reviewed: Allergy & Precautions, NPO status , Patient's Chart, lab work & pertinent test results  History of Anesthesia Complications Negative for: history of anesthetic complications  Airway Mallampati: II  TM Distance: >3 FB Neck ROM: Full    Dental no notable dental hx. (+) Teeth Intact   Pulmonary neg pulmonary ROS, neg sleep apnea, neg COPD, Patient abstained from smoking.Not current smoker,    Pulmonary exam normal breath sounds clear to auscultation       Cardiovascular Exercise Tolerance: Good METS(-) hypertension(-) CAD and (-) Past MI negative cardio ROS  (-) dysrhythmias  Rhythm:Regular Rate:Normal - Systolic murmurs    Neuro/Psych negative neurological ROS  negative psych ROS   GI/Hepatic neg GERD  ,(+)     (-) substance abuse  ,   Endo/Other  neg diabetes  Renal/GU negative Renal ROS     Musculoskeletal   Abdominal   Peds  Hematology   Anesthesia Other Findings Past Medical History: No date: No pertinent past medical history  Reproductive/Obstetrics                             Anesthesia Physical Anesthesia Plan  ASA: 1  Anesthesia Plan: General   Post-op Pain Management:    Induction: Intravenous  PONV Risk Score and Plan: 2 and Ondansetron, Propofol infusion and TIVA  Airway Management Planned: Nasal Cannula  Additional Equipment: None  Intra-op Plan:   Post-operative Plan:   Informed Consent: I have reviewed the patients History and Physical, chart, labs and discussed the procedure including the risks, benefits and alternatives for the proposed anesthesia with the patient or authorized representative who has indicated his/her understanding and acceptance.     Dental advisory given  Plan Discussed with: CRNA and Surgeon  Anesthesia Plan Comments: (Discussed  risks of anesthesia with patient, including possibility of difficulty with spontaneous ventilation under anesthesia necessitating airway intervention, PONV, and rare risks such as cardiac or respiratory or neurological events, and allergic reactions. Patient understands.)        Anesthesia Quick Evaluation

## 2021-06-01 ENCOUNTER — Encounter: Payer: Self-pay | Admitting: Gastroenterology

## 2021-06-01 LAB — SURGICAL PATHOLOGY

## 2021-06-10 ENCOUNTER — Telehealth (INDEPENDENT_AMBULATORY_CARE_PROVIDER_SITE_OTHER): Payer: Managed Care, Other (non HMO) | Admitting: Gastroenterology

## 2021-06-10 DIAGNOSIS — Z23 Encounter for immunization: Secondary | ICD-10-CM

## 2021-06-10 DIAGNOSIS — K900 Celiac disease: Secondary | ICD-10-CM

## 2021-06-10 NOTE — Progress Notes (Signed)
Lance Bennett , MD [address]  [address]  [place], [place] [address]  Main: [phone]  Fax: [phone]   Primary Care Physician: Lance Downer, MD  Virtual Visit via Video Note  I connected with patient on 06/10/21 at  8:30 AM EDT by video and verified that I am speaking with the correct person using two identifiers.   I discussed the limitations, risks, security and privacy concerns of performing an evaluation and management service by video  and the availability of in person appointments. I also discussed with the patient that there may be a patient responsible charge related to this service. The patient expressed understanding and agreed to proceed.  Location of Patient: Home Location of Provider: Home Persons involved: Patient and provider only   History of Present Illness:   Chief complaint: Discuss results of biopsies   HPI: Lance Bennett is a 40 y.o. male   Summary of history :  Initially referred and seen on 05/12/2021 for elevated alkaline phosphatase.Labs in February 2022 demonstrate an alkaline phosphatase of 148, hepatitis C virus antibody was negative in November 2021 as well as HIV.  Fractionated alkaline phosphatase was normal.  He underwent a right upper quadrant ultrasound that showed no abnormalities.  No risk factors that could suggest chronic liver disease risk factors.  Interval history   05/12/2021-06/10/2021  05/12/2021 positive endomysial antibody, TTG IgA.  Normal GGT, TSH, negative hepatitis serologies not immune to hepatitis a and B, ANA positive smooth muscle antibody as well as AMA negative iron studies normal CK normal PTH, calcium normal   05/29/2021 underwent EGD and biopsies of the small bowel showed partial villous blunting and increased intraepithelial lymphocytes  He is doing well already commenced on a gluten-free diet.  No symptoms. has children who have already had a blood test for celiac disease.  No current  outpatient medications on file.   No current facility-administered medications for this visit.    Allergies as of 06/10/2021   (No Known Allergies)    Review of Systems:    All systems reviewed and negative except where noted in HPI.  General Appearance:    Alert, cooperative, no distress, appears stated age  Head:    Normocephalic, without obvious abnormality, atraumatic  Eyes:    PERRL, conjunctiva/corneas clear,  Ears:    Grossly normal hearing    Neurologic:  Grossly normal    Observations/Objective:  Labs: CMP     Component Value Date/Time   NA 138 08/26/2020 1426   K 4.0 08/26/2020 1426   CL 102 08/26/2020 1426   CO2 22 08/26/2020 1426   GLUCOSE 95 08/26/2020 1426   BUN 11 08/26/2020 1426   CREATININE 0.88 08/26/2020 1426   CALCIUM 8.8 05/12/2021 1512   PROT 6.9 02/18/2021 0840   ALBUMIN 4.1 02/18/2021 0840   AST 29 02/18/2021 0840   ALT 37 02/18/2021 0840   ALKPHOS 145 (H) 02/18/2021 0840   BILITOT 0.4 02/18/2021 0840   GFRNONAA 110 08/26/2020 1426   GFRAA 127 08/26/2020 1426   No results found for: WBC, HGB, HCT, MCV, PLT  Imaging Studies: No results found.  Assessment and Plan:   Lance Bennett is a 40 y.o. y/o male initially referred for elevated alkaline phosphatase and as part of the work-up I ordered a complete autoimmune and viral hepatitis work-up which includes celiac serology that returned positive.  EGD and biopsies confirm that he has celiac's disease.  10% of patients with celiac disease  may have abnormal LFTs.  Plan 1.  Discussed etiology and need to go on a complete gluten-free diet.  Will refer to dietitian 2.  Since it is a genetic disorder suggest his first-degree relatives to get tested for celiac disease, children. 3.  Since it is an autoimmune disease in the future I have advised him that he should keep this in the back of his mind when talking with his doctors as he may have other autoimmune conditions which may manifest themselves  over the years. 4. I will check vitamin D, E, B12, copper, zinc, folic acid 5.  Follow-up in 4 to 5 months where I will recheck his liver function tests and I would expect the alkaline phosphatase to improve at that point of time 6.  I suggested him to attend celiac support group meetings, there is one at [facility] which he is aware of at this point of time. 7.  He is not immune to hepatitis a and B and will need vaccination.  He will also need appropriate and periodic flu shots, pneumococcal vaccine and COVID immunization boosters as they come along     I discussed the assessment and treatment plan with the patient. The patient was provided an opportunity to ask questions and all were answered. The patient agreed with the plan and demonstrated an understanding of the instructions.   The patient was advised to call back or seek an in-person evaluation if the symptoms worsen or if the condition fails to improve as anticipated.  I provided 25 minutes of face-to-face time during this encounter.  Dr Lance Mood MD,MRCP [facility]) Gastroenterology/Hepatology Pager: [phone]   Speech recognition software was used to dictate this note.

## 2021-06-20 LAB — VITAMIN D 1,25 DIHYDROXY
Vitamin D 1, 25 (OH)2 Total: 51 pg/mL
Vitamin D2 1, 25 (OH)2: <10 pg/mL
Vitamin D3 1, 25 (OH)2: 51 pg/mL

## 2021-06-20 LAB — B12 AND FOLATE PANEL
Folate: 2.2 ng/mL — ABNORMAL LOW (ref >3.0)
Vitamin B-12: 246 pg/mL (ref 232–1245)

## 2021-06-20 LAB — VITAMIN E
Vitamin E (Alpha Tocopherol): 7.2 mg/L (ref 5.9–19.4)
Vitamin E(Gamma Tocopherol): 1.5 mg/L (ref 0.7–4.9)

## 2021-06-20 LAB — ZINC: Zinc: 56 ug/dL (ref 44–115)

## 2021-06-20 LAB — COPPER, SERUM: Copper: 110 ug/dL (ref 69–132)

## 2021-06-20 LAB — VITAMIN A: Vitamin A: 49.2 ug/dL (ref 18.9–57.3)

## 2021-06-20 LAB — TSH: TSH: 2.64 u[IU]/mL (ref 0.450–4.500)

## 2021-06-23 ENCOUNTER — Telehealth: Payer: Self-pay

## 2021-06-23 MED ORDER — FOLIC ACID 1 MG PO TABS: 1.0000 mg | ORAL_TABLET | Freq: Every day | ORAL | 1 refills | Status: DC

## 2021-06-23 NOTE — Telephone Encounter (Signed)
-----   Message from Wyline Mood, MD sent at 06/17/2021  2:15 PM EDT ----- Inform folic acid level still low suggest to commence on folic acid 1 mg daily.  In note to pharmacy please mention it has to be gluten free

## 2021-06-23 NOTE — Telephone Encounter (Signed)
Patient was contacted but had to leave him a voicemail letting him know the below information. I also told him that if he had any questions to please give [place] a call.

## 2021-08-27 ENCOUNTER — Encounter: Payer: Self-pay | Admitting: Family Medicine

## 2021-08-27 ENCOUNTER — Other Ambulatory Visit: Payer: Self-pay

## 2021-08-27 ENCOUNTER — Ambulatory Visit (INDEPENDENT_AMBULATORY_CARE_PROVIDER_SITE_OTHER): Payer: Managed Care, Other (non HMO) | Admitting: Family Medicine

## 2021-08-27 VITALS — BP 110/70 | HR 58 | Resp 16 | Ht 69.0 in | Wt 203.9 lb

## 2021-08-27 DIAGNOSIS — Z0000 Encounter for general adult medical examination without abnormal findings: Secondary | ICD-10-CM | POA: Diagnosis not present

## 2021-08-27 DIAGNOSIS — Z23 Encounter for immunization: Secondary | ICD-10-CM

## 2021-08-27 DIAGNOSIS — K900 Celiac disease: Secondary | ICD-10-CM | POA: Diagnosis not present

## 2021-08-27 DIAGNOSIS — E669 Obesity, unspecified: Secondary | ICD-10-CM

## 2021-08-27 DIAGNOSIS — Z6830 Body mass index (BMI) 30.0-30.9, adult: Secondary | ICD-10-CM

## 2021-08-27 NOTE — Assessment & Plan Note (Signed)
Discussed importance of healthy weight management Discussed diet and exercise  

## 2021-08-27 NOTE — Assessment & Plan Note (Signed)
Discussed GF diet and cross contamination 

## 2021-08-27 NOTE — Progress Notes (Signed)
Complete physical exam   Patient: Lance Bennett   DOB: 1981-08-14   40 y.o. Male  MRN: [id number] Visit Date: 08/27/2021  Today's healthcare provider: Shirlee Latch, MD   Chief Complaint  Patient presents with   Annual Exam   Subjective    Lance Bennett is a 40 y.o. male who presents today for a complete physical exam.  He reports consuming a  no gluten  diet. Home exercise routine includes walking 1 hrs per 2-3 times a week. He generally feels well. He reports sleeping well. He does not have additional problems to discuss today.  HPI   Has started GF diet x40m after being diagnosed with Celiac disease.    Past Medical History:  Diagnosis Date   No pertinent past medical history    Past Surgical History:  Procedure Laterality Date   ESOPHAGOGASTRODUODENOSCOPY (EGD) WITH PROPOFOL N/A 05/29/2021   Procedure: ESOPHAGOGASTRODUODENOSCOPY (EGD) WITH PROPOFOL;  Surgeon: Wyline Mood, MD;  Location: [facility] ENDOSCOPY;  Service: Gastroenterology;  Laterality: N/A;   TONSILLECTOMY AND ADENOIDECTOMY  1996   Social History   Socioeconomic History   Marital status: Married    Spouse name: Not on file   Number of children: 3   Years of education: Not on file   Highest education level: Not on file  Occupational History   Occupation: Sport and exercise psychologist    Comment: [employer]  Tobacco Use   Smoking status: Never   Smokeless tobacco: Never  Vaping Use   Vaping Use: Never used  Substance and Sexual Activity   Alcohol use: Yes    Comment: Rarely, social   Drug use: Never   Sexual activity: Yes    Partners: Female  Other Topics Concern   Not on file  Social History Narrative   Not on file   Social Determinants of Health   Financial Resource Strain: Not on file  Food Insecurity: Not on file  Transportation Needs: Not on file  Physical Activity: Not on file  Stress: Not on file  Social Connections: Not on file  Intimate Partner Violence: Not on file   Family  Status  Relation Name Status   Mother  Alive   Father  Alive   Sister  Alive   Brother  Alive   MGM  Alive   MGF  Deceased   PGM  Deceased   PGF  Deceased   Neg Hx  (Not Specified)   Family History  Problem Relation Age of Onset   Diabetes Mother        diet-controlled   Healthy Father    Healthy Sister    Heart defect Brother    Diabetes Maternal Grandmother    Breast cancer Maternal Grandmother    Stroke Maternal Grandfather    Diabetes Maternal Grandfather    Transient ischemic attack Maternal Grandfather    Prostate cancer Neg Hx    Colon cancer Neg Hx    No Known Allergies  Patient Care Team: Erasmo Downer, MD as PCP - General (Family Medicine)   Medications: Outpatient Medications Prior to Visit  Medication Sig   [DISCONTINUED] folic acid (FOLVITE) 1 MG tablet Take 1 tablet (1 mg total) by mouth daily. (Patient not taking: Reported on 08/27/2021)   No facility-administered medications prior to visit.    Review of Systems  Cardiovascular:  Positive for chest pain (comes and goes (for the past three years)).  All other systems reviewed and are negative.    Objective  BP 110/70 (BP Location: Right Arm, Patient Position: Sitting, Cuff Size: Large)   Pulse (!) 58   Resp 16   Ht 5\' 9"  (1.753 m)   Wt 203 lb 14.4 oz (92.5 kg)   SpO2 98%   BMI 30.11 kg/m    Physical Exam Vitals reviewed.  Constitutional:      General: He is not in acute distress.    Appearance: Normal appearance. He is well-developed. He is not diaphoretic.  HENT:     Head: Normocephalic and atraumatic.     Right Ear: Tympanic membrane, ear canal and external ear normal.     Left Ear: Tympanic membrane, ear canal and external ear normal.     Nose: Nose normal.     Mouth/Throat:     Mouth: Mucous membranes are moist.     Pharynx: Oropharynx is clear. No oropharyngeal exudate.  Eyes:     General: No scleral icterus.    Conjunctiva/sclera: Conjunctivae normal.     Pupils:  Pupils are equal, round, and reactive to light.  Neck:     Thyroid: No thyromegaly.  Cardiovascular:     Rate and Rhythm: Normal rate and regular rhythm.     Pulses: Normal pulses.     Heart sounds: Normal heart sounds. No murmur heard. Pulmonary:     Effort: Pulmonary effort is normal. No respiratory distress.     Breath sounds: Normal breath sounds. No wheezing or rales.  Abdominal:     General: There is no distension.     Palpations: Abdomen is soft.     Tenderness: There is no abdominal tenderness.  Musculoskeletal:        General: No deformity.     Cervical back: Neck supple.     Right lower leg: No edema.     Left lower leg: No edema.  Lymphadenopathy:     Cervical: No cervical adenopathy.  Skin:    General: Skin is warm and dry.     Findings: Rash present.  Neurological:     Mental Status: He is alert and oriented to person, place, and time. Mental status is at baseline.     Sensory: No sensory deficit.     Motor: No weakness.     Gait: Gait normal.  Psychiatric:        Mood and Affect: Mood normal.        Behavior: Behavior normal.        Thought Content: Thought content normal.      Last depression screening scores PHQ 2/9 Scores 08/27/2021 12/02/2020 08/27/2020  PHQ - 2 Score 0 0 0  PHQ- 9 Score - 0 -   Last fall risk screening Fall Risk  08/27/2021  Falls in the past year? 0  Number falls in past yr: 0  Injury with Fall? 0  Risk for fall due to : -  Follow up -   Last Audit-C alcohol use screening Alcohol Use Disorder Test (AUDIT) 08/27/2021  1. How often do you have a drink containing alcohol? 2  2. How many drinks containing alcohol do you have on a typical day when you are drinking? 1  3. How often do you have six or more drinks on one occasion? 0  AUDIT-C Score 3  4. How often during the last year have you found that you were not able to stop drinking once you had started? 0  5. How often during the last year have you failed to do what was normally  expected  from you because of drinking? 0  6. How often during the last year have you needed a first drink in the morning to get yourself going after a heavy drinking session? 0  7. How often during the last year have you had a feeling of guilt of remorse after drinking? 0  8. How often during the last year have you been unable to remember what happened the night before because you had been drinking? 0  9. Have you or someone else been injured as a result of your drinking? 0  10. Has a relative or friend or a doctor or another health worker been concerned about your drinking or suggested you cut down? 0  Alcohol Use Disorder Identification Test Final Score (AUDIT) 3   A score of 3 or more in women, and 4 or more in men indicates increased risk for alcohol abuse, EXCEPT if all of the points are from question 1   No results found for any visits on 08/27/21.  Assessment & Plan    Routine Health Maintenance and Physical Exam  Exercise Activities and Dietary recommendations  Goals   None     Immunization History  Administered Date(s) Administered   Influenza,inj,Quad PF,6+ Mos 08/26/2020, 08/27/2021   PFIZER(Purple Top)SARS-COV-2 Vaccination 01/10/2020, 02/04/2020, 11/26/2020   Tdap 11/22/2020    Health Maintenance  Topic Date Due   COVID-19 Vaccine (4 - Booster for Pfizer series) 01/21/2021   TETANUS/TDAP  11/22/2030   INFLUENZA VACCINE  Completed   Hepatitis C Screening  Completed   HIV Screening  Completed   Pneumococcal Vaccine 60-30 Years old  Aged Out   HPV VACCINES  Aged Out    Discussed health benefits of physical activity, and encouraged him to engage in regular exercise appropriate for his age and condition.  Problem List Items Addressed This Visit       Digestive   Celiac disease    Discussed GF diet and cross contamination        Other   Obesity    Discussed importance of healthy weight management Discussed diet and exercise       Relevant Orders    Comprehensive metabolic panel   Lipid panel   Other Visit Diagnoses     Encounter for annual physical exam    -  Primary   Relevant Orders   Comprehensive metabolic panel   Lipid panel   Need for influenza vaccination       Relevant Orders   Flu Vaccine QUAD 82mo+IM (Fluarix, Fluzone & Alfiuria Quad PF) (Completed)        Return in about 1 year (around 08/27/2022) for CPE.     I, Shirlee Latch, MD, have reviewed all documentation for this visit. The documentation on 08/27/21 for the exam, diagnosis, procedures, and orders are all accurate and complete.   Chequita Mofield, Marzella Schlein, MD, MPH [facility]

## 2021-10-12 ENCOUNTER — Ambulatory Visit: Payer: Managed Care, Other (non HMO) | Admitting: Gastroenterology

## 2021-11-26 ENCOUNTER — Encounter: Payer: Self-pay | Admitting: Gastroenterology

## 2021-11-26 ENCOUNTER — Ambulatory Visit (INDEPENDENT_AMBULATORY_CARE_PROVIDER_SITE_OTHER): Payer: Managed Care, Other (non HMO) | Admitting: Gastroenterology

## 2021-11-26 ENCOUNTER — Other Ambulatory Visit: Payer: Self-pay

## 2021-11-26 VITALS — BP 146/82 | HR 67 | Temp 98.7°F | Wt 207.0 lb

## 2021-11-26 DIAGNOSIS — K900 Celiac disease: Secondary | ICD-10-CM | POA: Diagnosis not present

## 2021-11-26 DIAGNOSIS — E538 Deficiency of other specified B group vitamins: Secondary | ICD-10-CM | POA: Diagnosis not present

## 2021-11-26 DIAGNOSIS — R748 Abnormal levels of other serum enzymes: Secondary | ICD-10-CM

## 2021-11-26 NOTE — Progress Notes (Signed)
Wyline Mood MD, MRCP(U.K) [address]  [address]  [place], [place] [address]  Main: [phone]  Fax: [phone]   Primary Care Physician: Erasmo Downer, MD  Primary Gastroenterologist:  Dr. Wyline Mood   Chief Complaint  Patient presents with   Elevated alkaline phosphatase level   Celiac disease  HPI: Lance Bennett is a 41 y.o. male   Summary of history :  Initially referred and seen back in 04/2021 for elevated alkaline phosphatase, celiac disease.  Labs in February 2022 demonstrate an alkaline phosphatase of 148, hepatitis C virus antibody was negative in November 2021 as well as HIV.  Fractionated alkaline phosphatase was normal.  He underwent a right upper quadrant ultrasound that showed no abnormalities.   05/12/2021 positive endomysial antibody, TTG IgA.  Normal GGT, TSH, negative hepatitis serologies not immune to hepatitis a and B, ANA positive smooth muscle antibody as well as AMA negative iron studies normal CK normal PTH, calcium normal     05/29/2021 underwent EGD and biopsies of the small bowel showed partial villous blunting and increased intraepithelial lymphocytes   Interval history   06/10/2021-11/26/2021  06/11/2021: vitamins were norma. B 12 was borderline, Folic acid was low.  Is doing well.  Although we prescribed a folic acid at the last visit he did not stop taking it.  He is feeling much better after going on a gluten-free diet.  Adhering to recommendations.  Has had his children tested and they are negative.   No current outpatient medications on file.   No current facility-administered medications for this visit.    Allergies as of 11/26/2021   (No Known Allergies)    ROS:  General: Negative for anorexia, weight loss, fever, chills, fatigue, weakness. ENT: Negative for hoarseness, difficulty swallowing , nasal congestion. CV: Negative for chest pain, angina, palpitations, dyspnea on exertion, peripheral edema.  Respiratory:  Negative for dyspnea at rest, dyspnea on exertion, cough, sputum, wheezing.  GI: See history of present illness. GU:  Negative for dysuria, hematuria, urinary incontinence, urinary frequency, nocturnal urination.  Endo: Negative for unusual weight change.    Physical Examination:   BP (!) 146/82    Pulse 67    Temp 98.7 F (37.1 C) (Oral)    Wt 207 lb (93.9 kg)    BMI 30.57 kg/m   General: Well-nourished, well-developed in no acute distress.  Eyes: No icterus. Conjunctivae pink. Mouth: Oropharyngeal mucosa moist and pink , no lesions erythema or exudate. Lungs: Clear to auscultation bilaterally. Non-labored. Heart: Regular rate and rhythm, no murmurs rubs or gallops.  Abdomen: Bowel sounds are normal, nontender, nondistended, no hepatosplenomegaly or masses, no abdominal bruits or hernia , no rebound or guarding.   Extremities: No lower extremity edema. No clubbing or deformities. Neuro: Alert and oriented x 3.  Grossly intact. Skin: Warm and dry, no jaundice.   Psych: Alert and cooperative, normal mood and affect.   Imaging Studies: No results found.  Assessment and Plan:   Lance Bennett is a 41 y.o. y/o male here today to follow up for isolated elevated alkaline phosphatase level, as part of the work-up he had testing done and was found to have celiac disease.  10% of patients with celiac disease may have abnormal LFTs.   Plan 1.  Continue gluten-free diet 2.  Check b12 and folic acid  3.  We will check hepatitis A and B antibody status postvaccination 4  I will recheck his liver function tests and I  would expect the alkaline phosphatase to improve at that point of time  Dr Jonathon Bellows  MD,MRCP [facility]) Follow up in 6 months

## 2021-12-02 LAB — B12 AND FOLATE PANEL
Folate: 2.7 ng/mL — ABNORMAL LOW (ref >3.0)
Vitamin B-12: 318 pg/mL (ref 232–1245)

## 2021-12-02 LAB — CELIAC DISEASE AB SCREEN W/RFX
Antigliadin Abs, IgA: 15 units (ref 0–19)
IgA/Immunoglobulin A, Serum: 149 mg/dL (ref 90–386)
Transglutaminase IgA: 5 U/mL — ABNORMAL HIGH (ref 0–3)

## 2021-12-02 LAB — COMPREHENSIVE METABOLIC PANEL
ALT: 37 IU/L (ref 0–44)
AST: 26 IU/L (ref 0–40)
Albumin/Globulin Ratio: 1.1 — ABNORMAL LOW (ref 1.2–2.2)
Albumin: 4 g/dL (ref 4.0–5.0)
Alkaline Phosphatase: 137 IU/L — ABNORMAL HIGH (ref 44–121)
BUN/Creatinine Ratio: 12 (ref 9–20)
BUN: 12 mg/dL (ref 6–20)
Bilirubin Total: 0.4 mg/dL (ref 0.0–1.2)
CO2: 24 mmol/L (ref 20–29)
Calcium: 9.3 mg/dL (ref 8.7–10.2)
Chloride: 102 mmol/L (ref 96–106)
Creatinine, Ser: 0.99 mg/dL (ref 0.76–1.27)
Globulin, Total: 3.7 g/dL (ref 1.5–4.5)
Glucose: 92 mg/dL (ref 70–99)
Potassium: 4.1 mmol/L (ref 3.5–5.2)
Sodium: 141 mmol/L (ref 134–144)
Total Protein: 7.7 g/dL (ref 6.0–8.5)
eGFR: 99 mL/min/{1.73_m2} (ref >59)

## 2021-12-02 LAB — ENDOMYSIAL IGA ANTIBODY: Endomysial IgA: POSITIVE — AB

## 2021-12-18 DIAGNOSIS — R748 Abnormal levels of other serum enzymes: Secondary | ICD-10-CM

## 2021-12-18 DIAGNOSIS — K909 Intestinal malabsorption, unspecified: Secondary | ICD-10-CM

## 2021-12-18 DIAGNOSIS — K900 Celiac disease: Secondary | ICD-10-CM

## 2021-12-18 DIAGNOSIS — E538 Deficiency of other specified B group vitamins: Secondary | ICD-10-CM

## 2021-12-27 ENCOUNTER — Encounter: Payer: Self-pay | Admitting: Family Medicine

## 2021-12-27 ENCOUNTER — Encounter: Payer: Self-pay | Admitting: Emergency Medicine

## 2021-12-27 ENCOUNTER — Emergency Department: Payer: Managed Care, Other (non HMO)

## 2021-12-27 ENCOUNTER — Other Ambulatory Visit: Payer: Self-pay

## 2021-12-27 ENCOUNTER — Emergency Department
Admission: EM | Admit: 2021-12-27 | Discharge: 2021-12-27 | Disposition: A | Discharge status: Home / Self Care | Payer: Managed Care, Other (non HMO) | Attending: Emergency Medicine | Admitting: Emergency Medicine

## 2021-12-27 DIAGNOSIS — S92425A Nondisplaced fracture of distal phalanx of left great toe, initial encounter for closed fracture: Secondary | ICD-10-CM | POA: Insufficient documentation

## 2021-12-27 DIAGNOSIS — X58XXXA Exposure to other specified factors, initial encounter: Secondary | ICD-10-CM | POA: Diagnosis not present

## 2021-12-27 DIAGNOSIS — S99922A Unspecified injury of left foot, initial encounter: Secondary | ICD-10-CM | POA: Diagnosis present

## 2021-12-27 LAB — URIC ACID: Uric Acid, Serum: 7.7 mg/dL (ref 3.7–8.6)

## 2021-12-27 MED ORDER — HYDROCODONE-ACETAMINOPHEN 5-325 MG PO TABS: 1.0000 | ORAL_TABLET | Freq: Four times a day (QID) | ORAL | 0 refills | Status: DC | PRN

## 2021-12-27 MED ORDER — NAPROXEN 500 MG PO TABS: 500.0000 mg | ORAL_TABLET | Freq: Two times a day (BID) | ORAL | 0 refills | Status: DC

## 2021-12-27 NOTE — ED Provider Notes (Signed)
? ?[facility] ?Provider Note ? ? ? Event Date/Time  ? First MD Initiated Contact with Patient 12/27/21 1301   ?  (approximate) ? ? ?History  ? ?Toe Pain ? ? ?HPI ? ?Lance Bennett is a 41 y.o. male   presents to the ED with complaint of left great toe pain for the last 2 nights.  Patient states that in the past he has been told that he had gout.  He denies any recent injury to his toe.  He rates his pain as a 3 out of 10.  Patient was recently diagnosed with celiac's disease and has intrinsic eczema but otherwise no other health problems. ? ?  ? ?Physical Exam  ? ?Triage Vital Signs: ?ED Triage Vitals  ?Enc Vitals Group  ?   BP 12/27/21 1232 (!) 130/108  ?   Pulse Rate 12/27/21 1232 74  ?   Resp 12/27/21 1232 16  ?   Temp 12/27/21 1232 98.4 ?F (36.9 ?C)  ?   Temp Source 12/27/21 1232 Oral  ?   SpO2 12/27/21 1232 96 %  ?   Weight 12/27/21 1228 205 lb (93 kg)  ?   Height 12/27/21 1228 5\' 10"  (1.778 m)  ?   Head Circumference --   ?   Peak Flow --   ?   Pain Score 12/27/21 1228 3  ?   Pain Loc --   ?   Pain Edu? --   ?   Excl. in [place]? --   ? ? ?Most recent vital signs: ?Vitals:  ? 12/27/21 1232 12/27/21 1500  ?BP: (!) 130/108 122/88  ?Pulse: 74 77  ?Resp: 16 16  ?Temp: 98.4 ?F (36.9 ?C)   ?SpO2: 96% 97%  ? ? ? ?General: Awake, no distress.  ?CV:  Good peripheral perfusion.  ?Resp:  Normal effort.  ?Abd:  No distention.  ?Other:  Left MP joint first digit is erythematous, warm and tender to touch.  Range of motion is decreased secondary to pain.  No open sores noted during exam.  Patient does have fungal nails present. ? ? ?ED Results / Procedures / Treatments  ? ?Labs ?(all labs ordered are listed, but only abnormal results are displayed) ?Labs Reviewed  ?URIC ACID  ? ? ? ? ?RADIOLOGY ? ?Left great toe x-ray reviewed.  Radiology report nondisplaced fracture plantar aspect first digit distal phalanx. ? ? ?PROCEDURES: ? ?Critical Care performed:  ? ?Procedures ? ? ?MEDICATIONS ORDERED IN  ED: ?Medications - No data to display ? ? ?IMPRESSION / MDM / ASSESSMENT AND PLAN / ED COURSE  ?I reviewed the triage vital signs and the nursing notes. ? ? ?Differential diagnosis includes, but is not limited to,  ? ?41 year old male presents to the ED with complaint of left great toe pain for the last 2 days.  Patient denies any recent injury.  He has been told in the past that he most likely has some gout.  Onset is suspicious for gout and physical exam.  X-ray was taken and radiologist reports that there is a nondisplaced fracture first digit distal phalanx plantar aspect.  There is some concerns of a stress fracture and gout at play at the same time causing his increased pain.  Patient was placed in a postop shoe for support and protection.  He was started on naproxen 500 mg twice daily with food and a prescription for hydrocodone every 6 hours as needed for severe pain especially if he is trying  to sleep at night.  Patient will follow-up with Dr. Vickki Muff who is the podiatrist on-call if any continued problems or not improving. ?  ? ? ?FINAL CLINICAL IMPRESSION(S) / ED DIAGNOSES  ? ?Final diagnoses:  ?Closed nondisplaced fracture of distal phalanx of left great toe, initial encounter  ? ? ? ?Rx / DC Orders  ? ?ED Discharge Orders   ? ?      Ordered  ?  naproxen (NAPROSYN) 500 MG tablet  2 times daily with meals       ? 12/27/21 1441  ?  HYDROcodone-acetaminophen (NORCO/VICODIN) 5-325 MG tablet  Every 6 hours PRN       ? 12/27/21 1441  ? ?  ?  ? ?  ? ? ? ?Note:  This document was prepared using Dragon voice recognition software and may include unintentional dictation errors. ?  ?Johnn Hai, PA-C ?12/27/21 1517 ? ?  ?Lucrezia Starch, MD ?12/27/21 [phone] ? ?

## 2021-12-27 NOTE — Discharge Instructions (Signed)
Ice and elevation as needed for swelling and pain.  Also the naproxen 5 mg twice daily with food is used for gout as well as inflammation from fractures.  The Norco is every 6 hours if needed for moderate pain.  This can be taken every 6 hours if needed or just at bedtime.  Be aware that this medication is a narcotic and can cause drowsiness.  Follow-up with Dr. Ether Griffins if any continued problems.  Wear postop shoe for support and protection of your great toe. ?

## 2021-12-27 NOTE — ED Triage Notes (Signed)
Pt via POV from home. Pt c/o L great toe pain that started 2 nights ago. Denies injury. Pt is ambulatory to triage. Pt is A&Ox4 and NAD. ?

## 2021-12-27 NOTE — ED Notes (Signed)
Pt states L great toe is painful 5/10 since 3 days ago, states compression socks help somewhat, hurts to walk on toe, states L great toe swollen compared to L, toe is angled downwards compared to normal, recently was diagnosed with celiac disease, concerned about possible gout. ?

## 2021-12-27 NOTE — ED Notes (Signed)
Pt asking if bloodwork can be drawn (uric acid level) to determine if toe pain could also be related to gout. Sent msg to provider. ?

## 2021-12-31 ENCOUNTER — Encounter: Payer: Self-pay | Admitting: Podiatry

## 2021-12-31 ENCOUNTER — Ambulatory Visit (INDEPENDENT_AMBULATORY_CARE_PROVIDER_SITE_OTHER): Payer: Managed Care, Other (non HMO) | Admitting: Podiatry

## 2021-12-31 ENCOUNTER — Other Ambulatory Visit: Payer: Self-pay

## 2021-12-31 DIAGNOSIS — M10072 Idiopathic gout, left ankle and foot: Secondary | ICD-10-CM | POA: Diagnosis not present

## 2021-12-31 DIAGNOSIS — M7752 Other enthesopathy of left foot: Secondary | ICD-10-CM | POA: Diagnosis not present

## 2021-12-31 MED ORDER — COLCHICINE 0.6 MG PO TABS: 0.6000 mg | ORAL_TABLET | Freq: Every day | ORAL | 2 refills | Status: DC

## 2021-12-31 NOTE — Progress Notes (Signed)
?Subjective:  ?Patient ID: Lance Bennett, male    DOB: Oct 18, 1981,  MRN: [id number] ? ?Chief Complaint  ?Patient presents with  ? Fracture  ? ? ?41 y.o. male presents with the above complaint.  Patient presents with complaint left first MPJ pain.  Patient states it hurts with ambulation has progressive gotten worse.  He states that the redness he had about 6 months ago.  He states that he may think his gout.  He does not have any history of gout he does not have a family history of gout.  He went to get it evaluated they had done an uric acid level as well in the past which came back higher end of normal.  He states that it is getting a little bit better but still red hot swollen big toe joint.  He went to the emergency room for which they found a fracture to the IPJ joint.  He is not hurting there at all.  He is hurting mostly at the metatarsophalangeal joint.  He has been placed in a surgical shoe and has hurting while ambulating.  He states that he does have red meat intake right before gout flare.  He does not drink alcohol or eat too much fish. ? ?Past Medical History:  ?Diagnosis Date  ? No pertinent past medical history   ? ? ?Current Outpatient Medications:  ?  colchicine 0.6 MG tablet, Take 1 tablet (0.6 mg total) by mouth daily., Disp: 60 tablet, Rfl: 2 ?  HYDROcodone-acetaminophen (NORCO/VICODIN) 5-325 MG tablet, Take 1 tablet by mouth every 6 (six) hours as needed for moderate pain., Disp: 15 tablet, Rfl: 0 ?  naproxen (NAPROSYN) 500 MG tablet, Take 1 tablet (500 mg total) by mouth 2 (two) times daily with a meal., Disp: 30 tablet, Rfl: 0 ? ?Social History  ? ?Tobacco Use  ?Smoking Status Never  ?Smokeless Tobacco Never  ? ? ?No Known Allergies ?Objective:  ?There were no vitals filed for this visit. ?There is no height or weight on file to calculate BMI. ?Constitutional Well developed. ?Well nourished.  ?Vascular Dorsalis pedis pulses palpable bilaterally. ?Posterior tibial pulses palpable  bilaterally. ?Capillary refill normal to all digits.  ?No cyanosis or clubbing noted. ?Pedal hair growth normal.  ?Neurologic Normal speech. ?Oriented to person, place, and time. ?Epicritic sensation to light touch grossly present bilaterally.  ?Dermatologic Nails well groomed and normal in appearance. ?No open wounds. ?No skin lesions.  ?Orthopedic: Pain on palpation left first metatarsophalangeal joint.  Pain with range of motion of the joint.  No pain at the IPJ joint.  No pain at the fracture site.  Pain mostly at the metatarsophalangeal joint is red hot swollen.  ? ?Radiographs: 3 views of skeletally mature adult left foot: Fracture noted at the interphalangeal joint.  No fracture of the MPJ joint.  No bony abnormalities noted. ?Assessment:  ? ?1. Acute idiopathic gout involving toe of left foot   ?2. Capsulitis of metatarsophalangeal (MTP) joint of left foot   ? ?Plan:  ?Patient was evaluated and treated and all questions answered. ? ?Left first MPJ joint capsulitis with underlying gout flare ?-I explained to the patient the etiology of capsulitis and various treatment options were discussed.  Given the amount of pain that he is having I believe the patient would benefit from a steroid injection help decrease acute inflammatory component associate with pain.  Patient agrees with plan like to proceed with steroid injection. ?-A steroid injection was performed at left first MPJ  using 1% plain Lidocaine and 10 mg of Kenalog. This was well tolerated. ?-I discussed gout diet as well.  I discussed controlling his red meat diet.  He states understanding. ?-Colchicine was sent keep taking it until resolve meant. ? ? ?No follow-ups on file.  ?

## 2022-01-28 ENCOUNTER — Ambulatory Visit: Payer: Managed Care, Other (non HMO) | Admitting: Podiatry

## 2022-01-28 ENCOUNTER — Encounter: Payer: Self-pay | Admitting: Podiatry

## 2022-01-28 ENCOUNTER — Ambulatory Visit (INDEPENDENT_AMBULATORY_CARE_PROVIDER_SITE_OTHER): Payer: Managed Care, Other (non HMO) | Admitting: Podiatry

## 2022-01-28 DIAGNOSIS — M7752 Other enthesopathy of left foot: Secondary | ICD-10-CM

## 2022-01-28 DIAGNOSIS — M10072 Idiopathic gout, left ankle and foot: Secondary | ICD-10-CM

## 2022-01-28 DIAGNOSIS — M21961 Unspecified acquired deformity of right lower leg: Secondary | ICD-10-CM | POA: Diagnosis not present

## 2022-01-28 DIAGNOSIS — Q666 Other congenital valgus deformities of feet: Secondary | ICD-10-CM | POA: Diagnosis not present

## 2022-01-28 DIAGNOSIS — M21962 Unspecified acquired deformity of left lower leg: Secondary | ICD-10-CM | POA: Diagnosis not present

## 2022-01-28 NOTE — Progress Notes (Signed)
?Subjective:  ?Patient ID: Lance Bennett, male    DOB: 23-May-1981,  MRN: [id number] ? ?Chief Complaint  ?Patient presents with  ? Gout  ?  Pt stated that he is doing great no pain or discomfort  ? ? ?41 y.o. male presents with the above complaint.  Patient presents with follow-up to left first metatarsophalangeal joint gout flare.  Patient states he is doing a lot better the colchicine helped a steroid injection help.  He states he has had some pain here and there but overall much better improved and no further pain.  He is return to regular activities.  He denies any other acute complaints.  He also would like to discuss orthotics and shoe gear modification ? ?Past Medical History:  ?Diagnosis Date  ? No pertinent past medical history   ? ? ?Current Outpatient Medications:  ?  colchicine 0.6 MG tablet, Take 1 tablet (0.6 mg total) by mouth daily., Disp: 60 tablet, Rfl: 2 ?  HYDROcodone-acetaminophen (NORCO/VICODIN) 5-325 MG tablet, Take 1 tablet by mouth every 6 (six) hours as needed for moderate pain., Disp: 15 tablet, Rfl: 0 ?  naproxen (NAPROSYN) 500 MG tablet, Take 1 tablet (500 mg total) by mouth 2 (two) times daily with a meal., Disp: 30 tablet, Rfl: 0 ? ?Social History  ? ?Tobacco Use  ?Smoking Status Never  ?Smokeless Tobacco Never  ? ? ?No Known Allergies ?Objective:  ?There were no vitals filed for this visit. ?There is no height or weight on file to calculate BMI. ?Constitutional Well developed. ?Well nourished.  ?Vascular Dorsalis pedis pulses palpable bilaterally. ?Posterior tibial pulses palpable bilaterally. ?Capillary refill normal to all digits.  ?No cyanosis or clubbing noted. ?Pedal hair growth normal.  ?Neurologic Normal speech. ?Oriented to person, place, and time. ?Epicritic sensation to light touch grossly present bilaterally.  ?Dermatologic Nails well groomed and normal in appearance. ?No open wounds. ?No skin lesions.  ?Orthopedic: No further pain on palpation left first  metatarsophalangeal joint.  No further pain with range of motion of the joint.  No pain at the IPJ joint.  No pain at the fracture site.  No further pain m noted at the metatarsophalangeal joint and is no longer red hot swollen.  ? ?Radiographs: 3 views of skeletally mature adult left foot: Fracture noted at the interphalangeal joint.  No fracture of the MPJ joint.  No bony abnormalities noted. ?Assessment:  ? ?1. Acute idiopathic gout involving toe of left foot   ?2. Capsulitis of metatarsophalangeal (MTP) joint of left foot   ?3. Pes planovalgus   ?4. Deformity of both feet   ? ? ?Plan:  ?Patient was evaluated and treated and all questions answered. ? ?Left first MPJ joint capsulitis with underlying gout flare ?-I explained to the patient the etiology of capsulitis and various treatment options were discussed.  ?-At this time I discussed with him that diet control exercise tend to help the best with the management.  He states understanding and will do so and will continue to do so. ? ?Pes planovalgus/foot deformity ?-I explained to patient the etiology of pes planovalgus and relationship with first MPJ capsulitis and various treatment options were discussed.  Given patient foot structure in the setting of first MPJ capsulitis I believe patient will benefit from custom-made orthotics to help control the hindfoot motion support the arch of the foot and take the stress away from plantar fascial.  Patient agrees with the plan like to proceed with orthotics ?-Patient was casted for  orthotics with offloading of bilateral first metatarsophalangeal joint ? ? ?No follow-ups on file.  ?

## 2022-02-20 LAB — HEPATIC FUNCTION PANEL
ALT: 32 IU/L (ref 0–44)
AST: 21 IU/L (ref 0–40)
Albumin: 4.4 g/dL (ref 4.0–5.0)
Alkaline Phosphatase: 133 IU/L — ABNORMAL HIGH (ref 44–121)
Bilirubin Total: 0.4 mg/dL (ref 0.0–1.2)
Bilirubin, Direct: 0.1 mg/dL (ref 0.00–0.40)
Total Protein: 7.5 g/dL (ref 6.0–8.5)

## 2022-02-20 LAB — GAMMA GT: GGT: 30 IU/L (ref 0–65)

## 2022-02-20 LAB — B12 AND FOLATE PANEL
Folate: 15.5 ng/mL (ref >3.0)
Vitamin B-12: 394 pg/mL (ref 232–1245)

## 2022-02-20 LAB — PTH, INTACT AND CALCIUM
Calcium: 9.4 mg/dL (ref 8.7–10.2)
PTH: 16 pg/mL (ref 15–65)

## 2022-02-23 ENCOUNTER — Telehealth (INDEPENDENT_AMBULATORY_CARE_PROVIDER_SITE_OTHER): Payer: Managed Care, Other (non HMO) | Admitting: Gastroenterology

## 2022-02-23 ENCOUNTER — Other Ambulatory Visit: Payer: Self-pay

## 2022-02-23 DIAGNOSIS — Z23 Encounter for immunization: Secondary | ICD-10-CM

## 2022-02-23 DIAGNOSIS — K900 Celiac disease: Secondary | ICD-10-CM

## 2022-02-23 DIAGNOSIS — R748 Abnormal levels of other serum enzymes: Secondary | ICD-10-CM

## 2022-02-23 NOTE — Progress Notes (Signed)
?  ?Jonathon Bellows , MD ?[address]  ?[address]  ?[place], [place] [address]  ?Main: [phone]  ?Fax: [phone] ? ? ?Primary Care Physician: Virginia Crews, MD ? ?Virtual Visit via Video Note ? ?I connected with patient on 02/23/22 at  1:15 PM EDT by video and verified that I am speaking with the correct person using two identifiers. ?  ?I discussed the limitations, risks, security and privacy concerns of performing an evaluation and management service by video  and the availability of in person appointments. I also discussed with the patient that there may be a patient responsible charge related to this service. The patient expressed understanding and agreed to proceed. ? ?Location of Patient: Home ?Location of Provider: Home ?Persons involved: Patient and provider only ? ? ?History of Present Illness: ?Chief Complaint  ?Patient presents with  ? Celiac Disease  ? ? ?HPI: Lance Bennett is a 41 y.o. male ? ? ?Summary of history : ?  ?Initially referred and seen back in 04/2021 for elevated alkaline phosphatase, celiac disease.  Labs in February 2022 demonstrate an alkaline phosphatase of 148, hepatitis C virus antibody was negative in November 2021 as well as HIV.  Fractionated alkaline phosphatase was normal.  He underwent a right upper quadrant ultrasound that showed no abnormalities. ?  ?05/12/2021 positive endomysial antibody, TTG IgA.  Normal GGT, TSH, negative hepatitis serologies not immune to hepatitis a and B, ANA positive smooth muscle antibody as well as AMA negative iron studies normal CK normal PTH, calcium normal ?  ?  ?05/29/2021 underwent EGD and biopsies of the small bowel showed partial villous blunting and increased intraepithelial lymphocytes ?06/11/2021: vitamins were norma. B 12 was borderline, Folic acid was low.   ?  ?Interval history   11/26/2021-02/23/2022 ? ? ?02/19/2022:  GGT, PTH, CA , vitamin D, B12 , folate , - normal .  ?He is doing well on a gluten-free diet.  No new  complaints.  Taking folic acid tablets.  His gout is getting better. ? ? ?Current Outpatient Medications  ?Medication Sig Dispense Refill  ? colchicine 0.6 MG tablet Take 1 tablet (0.6 mg total) by mouth daily. 60 tablet 2  ? folic acid (FOLVITE) 1 MG tablet Take 1 mg by mouth daily.    ? HYDROcodone-acetaminophen (NORCO/VICODIN) 5-325 MG tablet Take 1 tablet by mouth every 6 (six) hours as needed for moderate pain. 15 tablet 0  ? naproxen (NAPROSYN) 500 MG tablet Take 1 tablet (500 mg total) by mouth 2 (two) times daily with a meal. 30 tablet 0  ? ?No current facility-administered medications for this visit.  ? ? ?Allergies as of 02/23/2022  ? (No Known Allergies)  ? ? ?Review of Systems:    ?All systems reviewed and negative except where noted in HPI.  ?General Appearance:    Alert, cooperative, no distress, appears stated age  ?Head:    Normocephalic, without obvious abnormality, atraumatic  ?Eyes:    PERRL, conjunctiva/corneas clear,  ?Ears:    Grossly normal hearing   ? ?Neurologic:  Grossly normal   ? ?Observations/Objective: ? ?Labs: ?CMP  ?   ?Component Value Date/Time  ? NA 141 11/26/2021 1428  ? K 4.1 11/26/2021 1428  ? CL 102 11/26/2021 1428  ? CO2 24 11/26/2021 1428  ? GLUCOSE 92 11/26/2021 1428  ? BUN 12 11/26/2021 1428  ? CREATININE 0.99 11/26/2021 1428  ? CALCIUM 9.4 02/19/2022 1049  ? PROT 7.5 02/19/2022 1049  ? ALBUMIN 4.4  02/19/2022 1049  ? AST 21 02/19/2022 1049  ? ALT 32 02/19/2022 1049  ? ALKPHOS 133 (H) 02/19/2022 1049  ? BILITOT 0.4 02/19/2022 1049  ? GFRNONAA 110 08/26/2020 1426  ? GFRAA 127 08/26/2020 1426  ? ?No results found for: WBC, HGB, HCT, MCV, PLT ? ?Imaging Studies: ?No results found. ? ?Assessment and Plan:  ? ?Lance Bennett is a 41 y.o. y/o male here today to follow up for isolated elevated alkaline phosphatase level, as part of the work-up he had testing done and was found to have celiac disease.  10% of patients with celiac disease may have abnormal LFTs.  His GGT was normal  indicating that the elevated alkaline phosphatase is less likely related to his liver.  It could be related to the gout or could be related from bone resorption.  Overall I think it is improving and I have a feeling that once we are on a gluten-free diet for 6 to 8 months his alkaline phosphatase should resolve to normal levels ?  ?Plan ?1.  Continue gluten-free diet ?2.  Check folic acid in 6 months ?3.  We will check hepatitis A and B antibody status postvaccination ?4 plan to recheck alkaline phosphatase in 6 months ? ? ? ?  ?I discussed the assessment and treatment plan with the patient. The patient was provided an opportunity to ask questions and all were answered. The patient agreed with the plan and demonstrated an understanding of the instructions. ?  ?The patient was advised to call back or seek an in-person evaluation if the symptoms worsen or if the condition fails to improve as anticipated. ? ?I provided 15 minutes of face-to-face time during this encounter. ? ?Dr Jonathon Bellows MD,MRCP [facility]) ?Gastroenterology/Hepatology ?Pager: [phone] ? ? ?Speech recognition software was used to dictate this note.   ?

## 2022-03-01 ENCOUNTER — Ambulatory Visit: Payer: Managed Care, Other (non HMO)

## 2022-03-01 DIAGNOSIS — Q666 Other congenital valgus deformities of feet: Secondary | ICD-10-CM

## 2022-03-01 DIAGNOSIS — M7752 Other enthesopathy of left foot: Secondary | ICD-10-CM

## 2022-03-01 DIAGNOSIS — M21961 Unspecified acquired deformity of right lower leg: Secondary | ICD-10-CM

## 2022-03-01 NOTE — Progress Notes (Signed)
SITUATION: ?Reason for Visit: Fitting and Delivery of Custom Fabricated Foot Orthoses ?Patient Report: Patient reports comfort and is satisfied with device. ? ?OBJECTIVE DATA: ?Patient History / Diagnosis:   ?  ICD-10-CM   ?1. Capsulitis of metatarsophalangeal (MTP) joint of left foot  M77.52   ?  ?2. Pes planovalgus  Q66.6   ?  ?3. Deformity of both feet  M21.961   ? Z16.967   ?  ? ? ?Provided Device:  Custom Functional Foot Orthotics ?    [employer]: [id number] ? ?GOAL OF ORTHOSIS ?- Improve gait ?- Decrease energy expenditure ?- Improve Balance ?- Provide Triplanar stability of foot complex ?- Facilitate motion ? ?ACTIONS PERFORMED ?Patient was fit with foot orthotics trimmed to shoe last. Patient tolerated fittign procedure.  ? ?Patient was provided with verbal and written instruction and demonstration regarding donning, doffing, wear, care, proper fit, function, purpose, cleaning, and use of the orthosis and in all related precautions and risks and benefits regarding the orthosis. ? ?Patient was also provided with verbal instruction regarding how to report any failures or malfunctions of the orthosis and necessary follow up care. Patient was also instructed to contact our office regarding any change in status that may affect the function of the orthosis. ? ?Patient demonstrated independence with proper donning, doffing, and fit and verbalized understanding of all instructions. ? ?PLAN: ?Patient is to follow up in one week or as necessary (PRN). All questions were answered and concerns addressed. Plan of care was discussed with and agreed upon by the patient. ? ?

## 2022-04-25 ENCOUNTER — Emergency Department
Admission: EM | Admit: 2022-04-25 | Discharge: 2022-04-25 | Disposition: A | Discharge status: Home / Self Care | Payer: Managed Care, Other (non HMO) | Attending: Emergency Medicine | Admitting: Emergency Medicine

## 2022-04-25 ENCOUNTER — Emergency Department: Payer: Managed Care, Other (non HMO)

## 2022-04-25 ENCOUNTER — Other Ambulatory Visit: Payer: Self-pay

## 2022-04-25 DIAGNOSIS — I861 Scrotal varices: Secondary | ICD-10-CM | POA: Insufficient documentation

## 2022-04-25 DIAGNOSIS — R1031 Right lower quadrant pain: Secondary | ICD-10-CM | POA: Diagnosis present

## 2022-04-25 NOTE — Discharge Instructions (Signed)
Take OTC ibuprofen as needed for pain. Follow-up with Urology as needed. Perform monthly testicular self-exams for screening for testicular cancer.

## 2022-04-25 NOTE — ED Triage Notes (Signed)
Pt to ED for R groin and testicular pain (mostly in testicle). Pain is dull, aching. Denies swelling. Pain started last night. Pt was splitting wood yesterday with an axe.  Pain worse with movement and palpation. No concerns for STI. Denies urinary symptoms.   Pt ambulatory to room, NAD.

## 2022-04-29 NOTE — ED Provider Notes (Signed)
[facility] Emergency Department Provider Note     Event Date/Time   First MD Initiated Contact with Patient 04/25/22 1911     (approximate)   History   Groin Pain (testicle)   HPI  Lance Bennett is a 41 y.o. male sent to the ED with right groin and testicular pain mostly the testicle region.  He reports the pain is dull in nature.  He denies any swelling, dysuria, hematuria, or urinary retention.  Reports symptom started last night.  He reports straining activity when he was playing wood yesterday with an ax.  He notes pain is worse with movement and with palpation.  He denies any concern for STI at this time.     Physical Exam   Triage Vital Signs: ED Triage Vitals  Enc Vitals Group     BP 04/25/22 1751 126/87     Pulse Rate 04/25/22 1751 74     Resp 04/25/22 1751 16     Temp 04/25/22 1751 98.9 F (37.2 C)     Temp Source 04/25/22 1751 Oral     SpO2 04/25/22 1751 98 %     Weight 04/25/22 1754 205 lb (93 kg)     Height 04/25/22 1754 5\' 9"  (1.753 m)     Head Circumference --      Peak Flow --      Pain Score 04/25/22 1753 6     Pain Loc --      Pain Edu? --      Excl. in GC? --     Most recent vital signs: Vitals:   04/25/22 1751 04/25/22 2009  BP: 126/87 123/85  Pulse: 74 73  Resp: 16 18  Temp: 98.9 F (37.2 C)   SpO2: 98% 99%    General Awake, no distress.  CV:  Good peripheral perfusion.  RESP:  Normal effort.  ABD:  No distention.  GU:  Declined by patient   ED Results / Procedures / Treatments   Labs (all labs ordered are listed, but only abnormal results are displayed) Labs Reviewed - No data to display   EKG   RADIOLOGY  I personally viewed and evaluated these images as part of my medical decision making, as well as reviewing the written report by the radiologist.  ED Provider Interpretation: bilateral varicoceles as noted}  06/26/22 Scrotum w/ Doppler  IMPRESSION: 1. Bilateral varicoceles. 2. Otherwise,  unremarkable testicular ultrasound.  PROCEDURES:  Critical Care performed: No  Procedures   MEDICATIONS ORDERED IN ED: Medications - No data to display   IMPRESSION / MDM / ASSESSMENT AND PLAN / ED COURSE  I reviewed the triage vital signs and the nursing notes.                              Differential diagnosis includes, but is not limited to, inguinal hernia, varicoceles, hydroceles, or testicular torsion  Patient's presentation is most consistent with acute complicated illness / injury requiring diagnostic workup.  Patient's diagnosis is consistent with bilateral varicoceles. Patient will be discharged home with instructions to take OTC Tylenol and Motrin. Patient is to follow up with urology as needed or otherwise directed. Patient is given ED precautions to return to the ED for any worsening or new symptoms.     FINAL CLINICAL IMPRESSION(S) / ED DIAGNOSES   Final diagnoses:  Bilateral varicoceles     Rx / DC Orders   ED Discharge  Orders     None        Note:  This document was prepared using Dragon voice recognition software and may include unintentional dictation errors.    Lissa Hoard, PA-C 04/30/22 0001    Shaune Pollack, MD 04/30/22 2233

## 2022-05-27 ENCOUNTER — Ambulatory Visit: Payer: Managed Care, Other (non HMO) | Admitting: Gastroenterology

## 2022-06-14 ENCOUNTER — Other Ambulatory Visit: Payer: Self-pay

## 2022-06-17 ENCOUNTER — Encounter: Payer: Self-pay | Admitting: Urology

## 2022-06-17 ENCOUNTER — Ambulatory Visit (INDEPENDENT_AMBULATORY_CARE_PROVIDER_SITE_OTHER): Payer: Managed Care, Other (non HMO) | Admitting: Urology

## 2022-06-17 VITALS — BP 132/79 | HR 66 | Ht 69.75 in | Wt 203.0 lb

## 2022-06-17 DIAGNOSIS — I861 Scrotal varices: Secondary | ICD-10-CM

## 2022-06-17 DIAGNOSIS — R102 Pelvic and perineal pain: Secondary | ICD-10-CM

## 2022-06-17 NOTE — Progress Notes (Signed)
   06/17/22 11:41 AM   Lance Bennett 1981-07-19 [id number]  CC: Bilateral varicoceles, scrotal pain  HPI: Healthy 41 year old male who presented to the ER on 04/25/2022 with scrotal pain after some physical activity and wood chopping.  Ultrasound was performed that showed no masses, but small varicoceles bilaterally.  He reports over the next few weeks his discomfort continued to improve, and he denies any problems over the last few weeks.  He denies any urinary symptoms.  He has 3 children, unsure if he is interested in further pregnancies.   PMH: Past Medical History:  Diagnosis Date   No pertinent past medical history    Varicocele     Surgical History: Past Surgical History:  Procedure Laterality Date   ESOPHAGOGASTRODUODENOSCOPY (EGD) WITH PROPOFOL N/A 05/29/2021   Procedure: ESOPHAGOGASTRODUODENOSCOPY (EGD) WITH PROPOFOL;  Surgeon: Wyline Mood, MD;  Location: [facility] ENDOSCOPY;  Service: Gastroenterology;  Laterality: N/A;   TONSILLECTOMY AND ADENOIDECTOMY  1996    Family History: Family History  Problem Relation Age of Onset   Diabetes Mother        diet-controlled   Healthy Father    Healthy Sister    Heart defect Brother    Diabetes Maternal Grandmother    Breast cancer Maternal Grandmother    Stroke Maternal Grandfather    Diabetes Maternal Grandfather    Transient ischemic attack Maternal Grandfather    Prostate cancer Neg Hx    Colon cancer Neg Hx     Social History:  reports that he has never smoked. He has never used smokeless tobacco. He reports current alcohol use. He reports that he does not use drugs.  Physical Exam: BP 132/79 (BP Location: Left Arm, Patient Position: Sitting, Cuff Size: Large)   Pulse 66   Ht 5' 9.75" (1.772 m)   Wt 203 lb (92.1 kg)   BMI 29.34 kg/m    Constitutional:  Alert and oriented, No acute distress. Cardiovascular: No clubbing, cyanosis, or edema. Respiratory: Normal respiratory effort, no increased work of  breathing. GI: Abdomen is soft, nontender, nondistended, no abdominal masses QI:[id number] phallus with patent meatus, no lesions, testicles 20 cc and descended bilaterally, very small varicoceles bilaterally  Laboratory Data: Reviewed  Pertinent Imaging: I have personally viewed and interpreted the scrotal ultrasound showing no masses, small incidental findings of bilateral varicoceles.  Assessment & Plan:   41 year old male with scrotal pain after strenuous activity and wood chopping that resolved spontaneously over the next few weeks.  Incidental finding of small bilateral varicoceles on scrotal ultrasound.  Reassurance was provided regarding the benign nature of these incidental small varicoceles, and that no further treatment is needed.  We discussed the relationship between large varicoceles and infertility, and that has not been an issue for him thus far.  We reviewed behavioral strategies including pelvic floor exercises and stretches, NSAIDs as needed, icing.  Follow-up with urology as needed  Legrand Rams, MD 06/17/2022  [facility] [address], [address] [place], [place] [address] [phone]

## 2022-06-17 NOTE — Patient Instructions (Signed)
Varicocele ? ?A varicocele is a swelling of veins in the scrotum. The scrotum is the sac that contains the testicles. Varicoceles can occur on either side of the scrotum, but they are more common on the left side. They occur most often in teenage boys and young men. ?In most cases, varicoceles are not a serious problem. They are usually small and painless and do not require treatment. Tests may be done to confirm the diagnosis. Treatment may be needed if: ?A varicocele is large, causes a lot of pain, or causes pain when exercising. ?Varicoceles are found on both sides of the scrotum. ?A varicocele causes a decrease in the size of the testicle in a growing adolescent. ?The person has fertility problems. ?What are the causes? ?This condition is the result of valves in the veins not working properly. Valves in the veins help to return blood from the scrotum and testicles to the heart. If these valves do not work well, blood flows backward and backs up into the veins, which causes the veins to swell. This is similar to what happens when varicose veins form in the leg. ?What are the signs or symptoms? ?Most varicoceles do not cause any symptoms. If symptoms do occur, they may include: ?Swelling on one side of the scrotum. The swelling may be more obvious when you are standing up. ?A lumpy feeling in the scrotum. ?A heavy feeling on one side of the scrotum. ?A dull ache in the scrotum, especially after exercise or prolonged standing or sitting. ?Slower growth or reduced size of the testicle on the side of the varicocele (in young males). ?Problems with fathering a child (fertility). This can occur if the testicle does not grow normally or if the condition causes problems with the sperm, such as a low sperm count or sperm that are not able to reach the egg (poor motility). ?How is this diagnosed? ?This condition is diagnosed based on: ?Your medical history. ?A physical exam. Your health care provider may inspect and feel  (palpate) the scrotal area to check for swollen or enlarged veins. ?An ultrasound. This may be done to confirm the diagnosis and to help rule out other causes of the swelling. ?How is this treated? ?Treatment is usually not needed for this condition. If you have any pain, your health care provider may prescribe or recommend medicine to help relieve it. You may need regular exams so your health care provider can monitor the varicocele to ensure that it does not cause problems. ?When further treatment is needed, it may involve one of these options: ?Varicocelectomy. This is a surgery in which the swollen veins are tied off so that the flow of blood goes to other veins instead. ?Embolization. In this procedure, a small, thin tube (catheter) is used to place metal coils or other blocking items in the veins. This cuts off the blood flow to the swollen veins. ?Follow these instructions at home: ?Take over-the-counter and prescription medicines only as told by your health care provider. ?Wear supportive underwear. ?Use an athletic supporter when participating in sports activities. ?Keep all follow-up visits as told by your health care provider. This is important. ?Contact a health care provider if: ?Your pain is increasing. ?You have redness in the affected area. ?Your testicle becomes enlarged, swollen, or painful. ?You have swelling that does not decrease when you are lying down. ?One of your testicles is smaller than the other. ?Get help right away if: ?You develop swelling in your legs. ?You have difficulty breathing. ?  Summary ?Varicocele is a condition in which the veins in the scrotum are swollen or enlarged. ?In most cases, varicoceles do not require treatment. ?Treatment may be needed if you have pain, have problems with infertility, or have a smaller testicle associated with the varicocele. ?In some cases, the condition may be treated with a procedure to cut off the flow of blood to the swollen veins. ?This  information is not intended to replace advice given to you by your health care provider. Make sure you discuss any questions you have with your health care provider. ?Document Revised: 12/29/2018 Document Reviewed: 12/29/2018 ?Elsevier Patient Education ? 2023 Elsevier Inc. ? ?

## 2022-08-04 ENCOUNTER — Encounter: Payer: Self-pay | Admitting: Gastroenterology

## 2022-08-04 DIAGNOSIS — K909 Intestinal malabsorption, unspecified: Secondary | ICD-10-CM

## 2022-08-04 DIAGNOSIS — E538 Deficiency of other specified B group vitamins: Secondary | ICD-10-CM

## 2022-08-04 DIAGNOSIS — R748 Abnormal levels of other serum enzymes: Secondary | ICD-10-CM

## 2022-08-04 DIAGNOSIS — K900 Celiac disease: Secondary | ICD-10-CM

## 2022-08-26 ENCOUNTER — Telehealth: Payer: Managed Care, Other (non HMO) | Admitting: Gastroenterology

## 2022-08-26 ENCOUNTER — Ambulatory Visit: Payer: Managed Care, Other (non HMO) | Admitting: Gastroenterology

## 2022-08-27 NOTE — Progress Notes (Deleted)
Complete physical exam   Patient: Lance Bennett   DOB: 02-05-1981   41 y.o. Male  MRN: [id number] Visit Date: 08/30/2022  Today's healthcare provider: Lavon Paganini, MD   No chief complaint on file.  Subjective    Lance Bennett is a 41 y.o. male who presents today for a complete physical exam.  He reports consuming a {diet types:17450} diet. {Exercise:19826} He generally feels {well/fairly well/poorly:18703}. He reports sleeping {well/fairly well/poorly:18703}. He {does/does not:200015} have additional problems to discuss today.  HPI    Past Medical History:  Diagnosis Date   No pertinent past medical history    Varicocele    Past Surgical History:  Procedure Laterality Date   ESOPHAGOGASTRODUODENOSCOPY (EGD) WITH PROPOFOL N/A 05/29/2021   Procedure: ESOPHAGOGASTRODUODENOSCOPY (EGD) WITH PROPOFOL;  Surgeon: Jonathon Bellows, MD;  Location: [facility] ENDOSCOPY;  Service: Gastroenterology;  Laterality: N/A;   TONSILLECTOMY AND ADENOIDECTOMY  1996   Social History   Socioeconomic History   Marital status: Married    Spouse name: Not on file   Number of children: 3   Years of education: Not on file   Highest education level: Not on file  Occupational History   Occupation: Financial planner    Comment: [employer]  Tobacco Use   Smoking status: Never   Smokeless tobacco: Never  Vaping Use   Vaping Use: Never used  Substance and Sexual Activity   Alcohol use: Yes    Comment: Rarely, social   Drug use: Never   Sexual activity: Yes    Partners: Female  Other Topics Concern   Not on file  Social History Narrative   Not on file   Social Determinants of Health   Financial Resource Strain: Not on file  Food Insecurity: Not on file  Transportation Needs: Not on file  Physical Activity: Not on file  Stress: Not on file  Social Connections: Not on file  Intimate Partner Violence: Not on file   Family Status  Relation Name Status   Mother  Alive   Father   Alive   Sister  [place]   MGF  Deceased   PGM  Deceased   PGF  Deceased   Neg Hx  (Not Specified)   Family History  Problem Relation Age of Onset   Diabetes Mother        diet-controlled   Healthy Father    Healthy Sister    Heart defect Brother    Diabetes Maternal Grandmother    Breast cancer Maternal Grandmother    Stroke Maternal Grandfather    Diabetes Maternal Grandfather    Transient ischemic attack Maternal Grandfather    Prostate cancer Neg Hx    Colon cancer Neg Hx    No Known Allergies  Patient Care Team: Virginia Crews, MD as PCP - General (Family Medicine)   Medications: Outpatient Medications Prior to Visit  Medication Sig   colchicine 0.6 MG tablet Take 1 tablet (0.6 mg total) by mouth daily.   folic acid (FOLVITE) 1 MG tablet Take 1 mg by mouth daily.   No facility-administered medications prior to visit.    Review of Systems  All other systems reviewed and are negative.   Last CBC No results found for: "WBC", "HGB", "HCT", "MCV", "MCH", "RDW", "PLT" Last metabolic panel Lab Results  Component Value Date   GLUCOSE 92 11/26/2021   NA 141 11/26/2021   K 4.1 11/26/2021  CL 102 11/26/2021   CO2 24 11/26/2021   BUN 12 11/26/2021   CREATININE 0.99 11/26/2021   EGFR 99 11/26/2021   CALCIUM 9.4 02/19/2022   PROT 7.5 02/19/2022   ALBUMIN 4.4 02/19/2022   LABGLOB 3.7 11/26/2021   AGRATIO 1.1 (L) 11/26/2021   BILITOT 0.4 02/19/2022   ALKPHOS 133 (H) 02/19/2022   AST 21 02/19/2022   ALT 32 02/19/2022   Last lipids Lab Results  Component Value Date   CHOL 141 08/26/2020   HDL 28 (L) 08/26/2020   LDLCALC 90 08/26/2020   TRIG 125 08/26/2020   CHOLHDL 5.0 08/26/2020   Last hemoglobin A1c No results found for: "HGBA1C" Last thyroid functions Lab Results  Component Value Date   TSH 2.640 06/11/2021   Last vitamin D No results found for: "25OHVITD2", "25OHVITD3", "VD25OH" Last vitamin B12 and Folate Lab  Results  Component Value Date   VITAMINB12 394 02/19/2022   FOLATE 15.5 02/19/2022      Objective    There were no vitals taken for this visit. BP Readings from Last 3 Encounters:  06/17/22 132/79  04/25/22 123/85  12/27/21 122/88   Wt Readings from Last 3 Encounters:  06/17/22 203 lb (92.1 kg)  04/25/22 205 lb (93 kg)  12/27/21 205 lb (93 kg)       Physical Exam  ***  Last depression screening scores    08/27/2021    2:01 PM 12/02/2020    3:41 PM 08/27/2020   11:12 AM  PHQ 2/9 Scores  PHQ - 2 Score 0 0 0  PHQ- 9 Score  0    Last fall risk screening    08/27/2021    2:01 PM  [place] in the past year? 0  Number falls in past yr: 0  Injury with Fall? 0   Last Audit-C alcohol use screening    08/27/2021    2:01 PM  Alcohol Use Disorder Test (AUDIT)  1. How often do you have a drink containing alcohol? 2  2. How many drinks containing alcohol do you have on a typical day when you are drinking? 1  3. How often do you have six or more drinks on one occasion? 0  AUDIT-C Score 3  4. How often during the last year have you found that you were not able to stop drinking once you had started? 0  5. How often during the last year have you failed to do what was normally expected from you because of drinking? 0  6. How often during the last year have you needed a first drink in the morning to get yourself going after a heavy drinking session? 0  7. How often during the last year have you had a feeling of guilt of remorse after drinking? 0  8. How often during the last year have you been unable to remember what happened the night before because you had been drinking? 0  9. Have you or someone else been injured as a result of your drinking? 0  10. Has a relative or friend or a doctor or another health worker been concerned about your drinking or suggested you cut down? 0  Alcohol Use Disorder Identification Test Final Score (AUDIT) 3   A score of 3 or more in women,  and 4 or more in men indicates increased risk for alcohol abuse, EXCEPT if all of the points are from question 1   No results found for any visits on 08/30/22.  Assessment & Plan    Routine Health Maintenance and Physical Exam  Exercise Activities and Dietary recommendations  Goals   None     Immunization History  Administered Date(s) Administered   Influenza,inj,Quad PF,6+ Mos 08/26/2020, 08/27/2021   PFIZER(Purple Top)SARS-COV-2 Vaccination 01/10/2020, 02/04/2020, 11/26/2020   Tdap 11/22/2020    Health Maintenance  Topic Date Due   COVID-19 Vaccine (4 - Pfizer series) 01/21/2021   INFLUENZA VACCINE  05/25/2022   TETANUS/TDAP  11/22/2030   Hepatitis C Screening  Completed   HIV Screening  Completed   HPV VACCINES  Aged Out    Discussed health benefits of physical activity, and encouraged him to engage in regular exercise appropriate for his age and condition.  ***  No follow-ups on file.     {provider attestation***:1}   Lavon Paganini, MD  [facility] [phone] (phone) [phone] (fax)  [place]

## 2022-08-30 ENCOUNTER — Encounter: Payer: Managed Care, Other (non HMO) | Admitting: Family Medicine

## 2022-08-30 DIAGNOSIS — K900 Celiac disease: Secondary | ICD-10-CM

## 2022-08-30 DIAGNOSIS — Z0000 Encounter for general adult medical examination without abnormal findings: Secondary | ICD-10-CM

## 2022-08-30 DIAGNOSIS — Z23 Encounter for immunization: Secondary | ICD-10-CM

## 2022-08-30 DIAGNOSIS — R748 Abnormal levels of other serum enzymes: Secondary | ICD-10-CM

## 2022-08-30 DIAGNOSIS — E669 Obesity, unspecified: Secondary | ICD-10-CM

## 2022-09-26 LAB — B12 AND FOLATE PANEL
Folate: 4 ng/mL (ref >3.0)
Vitamin B-12: 443 pg/mL (ref 232–1245)

## 2022-09-26 LAB — COMPREHENSIVE METABOLIC PANEL
ALT: 24 IU/L (ref 0–44)
AST: 22 IU/L (ref 0–40)
Albumin/Globulin Ratio: 1.4 (ref 1.2–2.2)
Albumin: 4.4 g/dL (ref 4.1–5.1)
Alkaline Phosphatase: 147 IU/L — ABNORMAL HIGH (ref 44–121)
BUN/Creatinine Ratio: 12 (ref 9–20)
BUN: 13 mg/dL (ref 6–20)
Bilirubin Total: 0.2 mg/dL (ref 0.0–1.2)
CO2: 25 mmol/L (ref 20–29)
Calcium: 9.2 mg/dL (ref 8.7–10.2)
Chloride: 101 mmol/L (ref 96–106)
Creatinine, Ser: 1.07 mg/dL (ref 0.76–1.27)
Globulin, Total: 3.2 g/dL (ref 1.5–4.5)
Glucose: 81 mg/dL (ref 70–99)
Potassium: 4.1 mmol/L (ref 3.5–5.2)
Sodium: 141 mmol/L (ref 134–144)
Total Protein: 7.6 g/dL (ref 6.0–8.5)
eGFR: 91 mL/min/{1.73_m2} (ref >59)

## 2022-09-26 LAB — CBC
Hematocrit: 42 % (ref 37.5–51.0)
Hemoglobin: 13.9 g/dL (ref 13.0–17.7)
MCH: 26.6 pg (ref 26.6–33.0)
MCHC: 33.1 g/dL (ref 31.5–35.7)
MCV: 80 fL (ref 79–97)
Platelets: 339 10*3/uL (ref 150–450)
RBC: 5.23 x10E6/uL (ref 4.14–5.80)
RDW: 13.1 % (ref 11.6–15.4)
WBC: 10.9 10*3/uL — ABNORMAL HIGH (ref 3.4–10.8)

## 2022-09-26 LAB — CELIAC DISEASE PANEL
Endomysial IgA: NEGATIVE
IgA/Immunoglobulin A, Serum: 148 mg/dL (ref 90–386)
Transglutaminase IgA: 3 U/mL (ref 0–3)

## 2022-09-26 LAB — HEPATITIS B SURFACE ANTIBODY,QUALITATIVE: Hep B Surface Ab, Qual: NONREACTIVE

## 2022-09-26 LAB — VITAMIN D 25 HYDROXY (VIT D DEFICIENCY, FRACTURES): Vit D, 25-Hydroxy: 23 ng/mL — ABNORMAL LOW (ref 30.0–100.0)

## 2022-09-26 LAB — HEPATITIS A ANTIBODY, TOTAL: hep A Total Ab: POSITIVE — AB

## 2022-09-27 ENCOUNTER — Ambulatory Visit (INDEPENDENT_AMBULATORY_CARE_PROVIDER_SITE_OTHER): Payer: Managed Care, Other (non HMO) | Admitting: Gastroenterology

## 2022-09-27 ENCOUNTER — Encounter: Payer: Self-pay | Admitting: Gastroenterology

## 2022-09-27 ENCOUNTER — Other Ambulatory Visit: Payer: Self-pay

## 2022-09-27 VITALS — BP 132/90 | HR 101 | Temp 98.7°F | Ht 69.5 in | Wt 212.1 lb

## 2022-09-27 DIAGNOSIS — E559 Vitamin D deficiency, unspecified: Secondary | ICD-10-CM

## 2022-09-27 DIAGNOSIS — R748 Abnormal levels of other serum enzymes: Secondary | ICD-10-CM

## 2022-09-27 DIAGNOSIS — Z23 Encounter for immunization: Secondary | ICD-10-CM | POA: Diagnosis not present

## 2022-09-27 DIAGNOSIS — K900 Celiac disease: Secondary | ICD-10-CM

## 2022-09-27 NOTE — Patient Instructions (Addendum)
Please have your labs drawn in 3 months. You could come here or go to a [facility] location. No need for an appointment.  Low-FODMAP Eating Plan  FODMAP stands for fermentable oligosaccharides, disaccharides, monosaccharides, and polyols. These are sugars that are hard for some people to digest. A low-FODMAP eating plan may help some people who have irritable bowel syndrome (IBS) and certain other bowel (intestinal) diseases to manage their symptoms. This meal plan can be complicated to follow. Work with a diet and nutrition specialist (dietitian) to make a low-FODMAP eating plan that is right for you. A dietitian can help make sure that you get enough nutrition from this diet. What are tips for following this plan? Reading food labels Check labels for hidden FODMAPs such as: High-fructose syrup. Honey. Agave. Natural fruit flavors. Onion or garlic powder. Choose low-FODMAP foods that contain 3-4 grams of fiber per serving. Check food labels for serving sizes. Eat only one serving at a time to make sure FODMAP levels stay low. Shopping Shop with a list of foods that are recommended on this diet and make a meal plan. Meal planning Follow a low-FODMAP eating plan for up to 6 weeks, or as told by your health care provider or dietitian. To follow the eating plan: Eliminate high-FODMAP foods from your diet completely. Choose only low-FODMAP foods to eat. You will do this for 2-6 weeks. Gradually reintroduce high-FODMAP foods into your diet one at a time. Most people should wait a few days before introducing the next new high-FODMAP food into their meal plan. Your dietitian can recommend how quickly you may reintroduce foods. Keep a daily record of what and how much you eat and drink. Make note of any symptoms that you have after eating. Review your daily record with a dietitian regularly to identify which foods you can eat and which foods you should avoid. General tips Drink enough fluid each day  to keep your urine pale yellow. Avoid processed foods. These often have added sugar and may be high in FODMAPs. Avoid most dairy products, whole grains, and sweeteners. Work with a dietitian to make sure you get enough fiber in your diet. Avoid high FODMAP foods at meals to manage symptoms. Recommended foods Fruits Bananas, oranges, tangerines, lemons, limes, blueberries, raspberries, strawberries, grapes, cantaloupe, honeydew melon, kiwi, papaya, passion fruit, and pineapple. Limited amounts of dried cranberries, banana chips, and shredded coconut. Vegetables Eggplant, zucchini, cucumber, peppers, green beans, bean sprouts, lettuce, arugula, kale, Swiss chard, spinach, collard greens, bok choy, summer squash, potato, and tomato. Limited amounts of corn, carrot, and sweet potato. Green parts of scallions. Grains Gluten-free grains, such as rice, oats, buckwheat, quinoa, corn, polenta, and millet. Gluten-free pasta, bread, or cereal. Rice noodles. Corn tortillas. Meats and other proteins Unseasoned beef, pork, poultry, or fish. Eggs. Tomasa Blase. Tofu (firm) and tempeh. Limited amounts of nuts and seeds, such as almonds, walnuts, [place] nuts, pecans, peanuts, nut butters, pumpkin seeds, chia seeds, and sunflower seeds. Dairy Lactose-free milk, yogurt, and kefir. Lactose-free cottage cheese and ice cream. Non-dairy milks, such as almond, coconut, hemp, and rice milk. Non-dairy yogurt. Limited amounts of goat cheese, brie, mozzarella, parmesan, swiss, and other hard cheeses. Fats and oils Butter-free spreads. Vegetable oils, such as olive, canola, and sunflower oil. Seasoning and other foods Artificial sweeteners with names that do not end in "ol," such as aspartame, saccharine, and stevia. Maple syrup, white table sugar, raw sugar, brown sugar, and molasses. Mayonnaise, soy sauce, and tamari. Fresh basil, coriander, parsley, rosemary, and  thyme. Beverages Water and mineral water. Sugar-sweetened soft  drinks. Small amounts of orange juice or cranberry juice. Black and green tea. Most dry wines. Coffee. The items listed above may not be a complete list of foods and beverages you can eat. Contact a dietitian for more information. Foods to avoid Fruits Fresh, dried, and juiced forms of apple, pear, watermelon, peach, plum, cherries, apricots, blackberries, boysenberries, figs, nectarines, and mango. Avocado. Vegetables Chicory root, artichoke, asparagus, cabbage, snow peas, Brussels sprouts, broccoli, sugar snap peas, mushrooms, celery, and cauliflower. Onions, garlic, leeks, and the white part of scallions. Grains Wheat, including kamut, durum, and semolina. Barley and bulgur. Couscous. Wheat-based cereals. Wheat noodles, bread, crackers, and pastries. Meats and other proteins Fried or fatty meat. Sausage. Cashews and pistachios. Soybeans, baked beans, black beans, chickpeas, kidney beans, fava beans, navy beans, lentils, black-eyed peas, and split peas. Dairy Milk, yogurt, ice cream, and soft cheese. Cream and sour cream. Milk-based sauces. Custard. Buttermilk. Soy milk. Seasoning and other foods Any sugar-free gum or candy. Foods that contain artificial sweeteners such as sorbitol, mannitol, isomalt, or xylitol. Foods that contain honey, high-fructose corn syrup, or agave. Bouillon, vegetable stock, beef stock, and chicken stock. Garlic and onion powder. Condiments made with onion, such as hummus, chutney, pickles, relish, salad dressing, and salsa. Tomato paste. Beverages Chicory-based drinks. Coffee substitutes. Chamomile tea. Fennel tea. Sweet or fortified wines such as port or sherry. Diet soft drinks made with isomalt, mannitol, maltitol, sorbitol, or xylitol. Apple, pear, and mango juice. Juices with high-fructose corn syrup. The items listed above may not be a complete list of foods and beverages you should avoid. Contact a dietitian for more information. Summary FODMAP stands for  fermentable oligosaccharides, disaccharides, monosaccharides, and polyols. These are sugars that are hard for some people to digest. A low-FODMAP eating plan is a short-term diet that helps to ease symptoms of certain bowel diseases. The eating plan usually lasts up to 6 weeks. After that, high-FODMAP foods are reintroduced gradually and one at a time. This can help you find out which foods may be causing symptoms. A low-FODMAP eating plan can be complicated. It is best to work with a dietitian who has experience with this type of plan. This information is not intended to replace advice given to you by your health care provider. Make sure you discuss any questions you have with your health care provider. Document Revised: 02/28/2020 Document Reviewed: 02/28/2020 Elsevier Patient Education  2023 [employer].

## 2022-09-27 NOTE — Addendum Note (Signed)
Addended by: Adela Ports on: 09/27/2022 02:44 PM   Modules accepted: Orders

## 2022-09-27 NOTE — Progress Notes (Signed)
Wyline Mood MD, MRCP(U.K) [address]  [address]  [place], [place] [address]  Main: [phone]  Fax: [phone]   Primary Care Physician: Erasmo Downer, MD  Primary Gastroenterologist:  Dr. Wyline Mood   Chief Complaint  Patient presents with   Celiac Disease    HPI: Lance Bennett is a 41 y.o. male   Summary of history :  Being followed for celiac disease and elevated LFTs attributed to celiac disease  Initially referred and seen back in 04/2021 for elevated alkaline phosphatase, celiac disease.  Labs in February 2022 demonstrate an alkaline phosphatase of 148, hepatitis C virus antibody was negative in November 2021 as well as HIV.  Fractionated alkaline phosphatase was normal.  He underwent a right upper quadrant ultrasound that showed no abnormalities.   05/12/2021 positive endomysial antibody, TTG IgA.  Normal GGT, TSH, negative hepatitis serologies not immune to hepatitis a and B, ANA positive smooth muscle antibody as well as AMA negative iron studies normal CK normal PTH, calcium normal     05/29/2021 underwent EGD and biopsies of the small bowel showed partial villous blunting and increased intraepithelial lymphocytes 06/11/2021: vitamins were norma. B 12 was borderline, Folic acid was low.   02/19/2022:  GGT, PTH, CA , vitamin D, B12 , folate , - normal .     Interval history 02/23/2022-09/27/2022   09/24/2022: Vitamin D low at 23, B12 folate normal, tissue transglutaminase antibody not detected endometrial antibody negative, hepatitis B nonreactive hepatitis A positive, alkaline phosphatase 147 hemoglobin 13.9 g.  Doing well on a gluten-free diet very compliant only complaint is occasional bloating consumes some amount of sweet tea.  Has informed her siblings to get tested for celiac disease.  No other complaint.  He has had issues with possible gout going to get evaluated by his primary care doctor  Current Outpatient Medications  Medication Sig  Dispense Refill   colchicine 0.6 MG tablet Take 1 tablet (0.6 mg total) by mouth daily. 60 tablet 2   folic acid (FOLVITE) 1 MG tablet Take 1 mg by mouth daily.     No current facility-administered medications for this visit.    Allergies as of 09/27/2022   (No Known Allergies)    ROS:  General: Negative for anorexia, weight loss, fever, chills, fatigue, weakness. ENT: Negative for hoarseness, difficulty swallowing , nasal congestion. CV: Negative for chest pain, angina, palpitations, dyspnea on exertion, peripheral edema.  Respiratory: Negative for dyspnea at rest, dyspnea on exertion, cough, sputum, wheezing.  GI: See history of present illness. GU:  Negative for dysuria, hematuria, urinary incontinence, urinary frequency, nocturnal urination.  Endo: Negative for unusual weight change.    Physical Examination:   There were no vitals taken for this visit.  General: Well-nourished, well-developed in no acute distress.  Eyes: No icterus. Conjunctivae pink. Mouth: Oropharyngeal mucosa moist and pink , no lesions erythema or exudate. Lungs: Clear to auscultation bilaterally. Non-labored. Heart: Regular rate and rhythm, no murmurs rubs or gallops.  Abdomen: Bowel sounds are normal, nontender, nondistended, no hepatosplenomegaly or masses, no abdominal bruits or hernia , no rebound or guarding.   Extremities: No lower extremity edema. No clubbing or deformities. Neuro: Alert and oriented x 3.  Grossly intact. Skin: Warm and dry, no jaundice.   Psych: Alert and cooperative, normal mood and affect.   Imaging Studies: No results found.  Assessment and Plan:   Lance Bennett is a 41 y.o. y/o male here today to follow up  for isolated elevated alkaline phosphatase level, as part of the work-up he had testing done and was found to have celiac disease.  10% of patients with celiac disease may have abnormal LFTs.  His GGT was normal indicating that the elevated alkaline phosphatase is  less likely related to his liver.  It could be related to the gout or could be related from bone resorption versus history of fracture of his toe.Marland Kitchen  His celiac serology   Plan 1.  Continue gluten-free diet 2.  Replace vitamin D, check micronutrients recheck vitamin D in 3 months 3.  Immune to hepatitis A but not immune to hepatitis B will need vaccination 4   low FODMAP diet for bloating, recheck LFTs in 6 months    Dr Wyline Mood  MD,MRCP [facility]) Follow up in 6 months

## 2022-09-27 NOTE — Addendum Note (Signed)
Addended by: Adela Ports on: 09/27/2022 04:01 PM   Modules accepted: Orders

## 2022-09-30 MED ORDER — VITAMIN D (ERGOCALCIFEROL) 1.25 MG (50000 UNIT) PO CAPS: 50000.0000 [IU] | ORAL_CAPSULE | ORAL | 0 refills | Status: DC

## 2022-09-30 NOTE — Addendum Note (Signed)
Addended by: Adela Ports on: 09/30/2022 02:22 PM   Modules accepted: Orders

## 2022-10-05 LAB — ZINC: Zinc: 62 ug/dL (ref 44–115)

## 2022-10-05 LAB — VITAMIN B1: Thiamine: 112.5 nmol/L (ref 66.5–200.0)

## 2022-10-05 LAB — VITAMIN A: Vitamin A: 37.8 ug/dL (ref 18.9–57.3)

## 2022-10-05 LAB — COPPER, SERUM: Copper: 128 ug/dL (ref 69–132)

## 2022-10-05 LAB — VITAMIN E
Vitamin E (Alpha Tocopherol): 7.5 mg/L (ref 5.9–19.4)
Vitamin E(Gamma Tocopherol): 1.2 mg/L (ref 0.7–4.9)

## 2022-10-05 LAB — VITAMIN K1, SERUM: VITAMIN K1: 0.36 ng/mL (ref 0.10–2.20)

## 2022-10-05 LAB — GAMMA GT: GGT: 32 IU/L (ref 0–65)

## 2022-10-05 LAB — VITAMIN B6: Vitamin B6: 8 ug/L (ref 3.4–65.2)

## 2022-11-03 NOTE — Progress Notes (Unsigned)
I,Sofhia Ulibarri S Fatema Rabe,acting as a Education administrator for Lavon Paganini, MD.,have documented all relevant documentation on the behalf of Lavon Paganini, MD,as directed by  Lavon Paganini, MD while in the presence of Lavon Paganini, MD.    Complete physical exam   Patient: Lance Bennett   DOB: [birth year]   42 y.o. Male  MRN: [id number] Visit Date: 11/04/2022  Today's healthcare provider: Lavon Paganini, MD   Chief Complaint  Patient presents with   Annual Exam   Rash   Subjective    Lance Bennett is a 42 y.o. male who presents today for a complete physical exam.  He reports consuming a  gluten free  diet. The patient has a physically strenuous job, but has no regular exercise apart from work.  He generally feels well. He reports sleeping well. He does have additional problems to discuss today.  HPI  Patient C/O rash on leg and buttocks on and off.  Somewhat itchy or painful. Antifungal cream makes it go away in a few days. Can be dark in color.  And toe nails problem. No change since being on a GF diet  L foot big toe has had pain intermittently.  Finds that popping the knuckle seems to help. Tried colchicine once. Staying well hydrated. Saw podiatry.  Did get better after CSI.  Saw urology for varicocele.  Still seeing GI for celiac disease.  Had stomach pain episodes and vomiting in HS and no one found out what was going on then. Feels like this may have been a gluten reaction. Had episodes of blood in stool.  Hep B non-immune - first dose 12/4 of Heplisav, needs 2nd dose - we do not have in stock - should get repeat testing 1-2 months after 2nd shot to confirm immunity.   Past Medical History:  Diagnosis Date   No pertinent past medical history    Varicocele    Past Surgical History:  Procedure Laterality Date   ESOPHAGOGASTRODUODENOSCOPY (EGD) WITH PROPOFOL N/A 05/29/2021   Procedure: ESOPHAGOGASTRODUODENOSCOPY (EGD) WITH PROPOFOL;  Surgeon: Jonathon Bellows, MD;   Location: [facility] ENDOSCOPY;  Service: Gastroenterology;  Laterality: N/A;   TONSILLECTOMY AND ADENOIDECTOMY  1996   Social History   Socioeconomic History   Marital status: Married    Spouse name: Not on file   Number of children: 3   Years of education: Not on file   Highest education level: Not on file  Occupational History   Occupation: Financial planner    Comment: [employer]  Tobacco Use   Smoking status: Never   Smokeless tobacco: Never  Vaping Use   Vaping Use: Never used  Substance and Sexual Activity   Alcohol use: Yes    Comment: Rarely, social   Drug use: Never   Sexual activity: Yes    Partners: Female  Other Topics Concern   Not on file  Social History Narrative   Not on file   Social Determinants of Health   Financial Resource Strain: Not on file  Food Insecurity: Not on file  Transportation Needs: Not on file  Physical Activity: Not on file  Stress: Not on file  Social Connections: Not on file  Intimate Partner Violence: Not on file   Family Status  Relation Name Status   Mother  Alive   Father  Alive   Sister  Alive   Brother  Alive   MGM  Alive   MGF  Deceased   Ashby  Deceased   PGF  Deceased  Neg Hx  (Not Specified)   Family History  Problem Relation Age of Onset   Diabetes Mother        diet-controlled   Healthy Father    Healthy Sister    Heart defect Brother    Diabetes Maternal Grandmother    Breast cancer Maternal Grandmother    Stroke Maternal Grandfather    Diabetes Maternal Grandfather    Transient ischemic attack Maternal Grandfather    Prostate cancer Neg Hx    Colon cancer Neg Hx    No Known Allergies  Patient Care Team: Erasmo Downer, MD as PCP - General (Family Medicine)   Medications: Outpatient Medications Prior to Visit  Medication Sig   colchicine 0.6 MG tablet Take 1 tablet (0.6 mg total) by mouth daily.   folic acid (FOLVITE) 1 MG tablet Take 1 mg by mouth daily.   Vitamin D, Ergocalciferol,  (DRISDOL) 1.25 MG (50000 UNIT) CAPS capsule Take 1 capsule (50,000 Units total) by mouth every 7 (seven) days. For 6 weeks.   No facility-administered medications prior to visit.    Review of Systems  Skin:  Positive for rash.  All other systems reviewed and are negative.   Last CBC Lab Results  Component Value Date   WBC 10.9 (H) 09/24/2022   HGB 13.9 09/24/2022   HCT 42.0 09/24/2022   MCV 80 09/24/2022   MCH 26.6 09/24/2022   RDW 13.1 09/24/2022   PLT 339 09/24/2022   Last metabolic panel Lab Results  Component Value Date   GLUCOSE 81 09/24/2022   NA 141 09/24/2022   K 4.1 09/24/2022   CL 101 09/24/2022   CO2 25 09/24/2022   BUN 13 09/24/2022   CREATININE 1.07 09/24/2022   EGFR 91 09/24/2022   CALCIUM 9.2 09/24/2022   PROT 7.6 09/24/2022   ALBUMIN 4.4 09/24/2022   LABGLOB 3.2 09/24/2022   AGRATIO 1.4 09/24/2022   BILITOT 0.2 09/24/2022   ALKPHOS 147 (H) 09/24/2022   AST 22 09/24/2022   ALT 24 09/24/2022   Last lipids Lab Results  Component Value Date   CHOL 141 08/26/2020   HDL 28 (L) 08/26/2020   LDLCALC 90 08/26/2020   TRIG 125 08/26/2020   CHOLHDL 5.0 08/26/2020   Last thyroid functions Lab Results  Component Value Date   TSH 2.640 06/11/2021   Last vitamin D Lab Results  Component Value Date   VD25OH 23.0 (L) 09/24/2022   Last vitamin B12 and Folate Lab Results  Component Value Date   VITAMINB12 443 09/24/2022   FOLATE 4.0 09/24/2022      Objective    BP 114/78 (BP Location: Left Arm, Patient Position: Sitting, Cuff Size: Large)   Pulse 65   Temp 98.7 F (37.1 C) (Temporal)   Resp 16   Ht 5' 9.75" (1.772 m)   Wt 209 lb (94.8 kg)   SpO2 100%   BMI 30.20 kg/m  BP Readings from Last 3 Encounters:  11/04/22 114/78  09/27/22 (!) 132/90  06/17/22 132/79   Wt Readings from Last 3 Encounters:  11/04/22 209 lb (94.8 kg)  09/27/22 212 lb 1.6 oz (96.2 kg)  06/17/22 203 lb (92.1 kg)     Physical Exam Vitals reviewed.   Constitutional:      General: He is not in acute distress.    Appearance: Normal appearance. He is well-developed. He is not diaphoretic.  HENT:     Head: Normocephalic and atraumatic.     Right Ear: Tympanic membrane, ear  canal and external ear normal.     Left Ear: Tympanic membrane, ear canal and external ear normal.     Nose: Nose normal.     Mouth/Throat:     Mouth: Mucous membranes are moist.     Pharynx: Oropharynx is clear. No oropharyngeal exudate.  Eyes:     General: No scleral icterus.    Conjunctiva/sclera: Conjunctivae normal.     Pupils: Pupils are equal, round, and reactive to light.  Neck:     Thyroid: No thyromegaly.  Cardiovascular:     Rate and Rhythm: Normal rate and regular rhythm.     Pulses: Normal pulses.     Heart sounds: Normal heart sounds. No murmur heard. Pulmonary:     Effort: Pulmonary effort is normal. No respiratory distress.     Breath sounds: Normal breath sounds. No wheezing or rales.  Abdominal:     General: There is no distension.     Palpations: Abdomen is soft.     Tenderness: There is no abdominal tenderness.  Musculoskeletal:        General: No deformity.     Cervical back: Neck supple.     Right lower leg: No edema.     Left lower leg: No edema.  Lymphadenopathy:     Cervical: No cervical adenopathy.  Skin:    General: Skin is warm and dry.     Findings: Rash present.  Neurological:     Mental Status: He is alert and oriented to person, place, and time. Mental status is at baseline.     Sensory: No sensory deficit.     Motor: No weakness.     Gait: Gait normal.  Psychiatric:        Mood and Affect: Mood normal.        Behavior: Behavior normal.        Thought Content: Thought content normal.       Last depression screening scores    11/04/2022    9:36 AM 08/27/2021    2:01 PM 12/02/2020    3:41 PM  PHQ 2/9 Scores  PHQ - 2 Score 0 0 0  PHQ- 9 Score 0  0   Last fall risk screening    11/04/2022    9:36 AM  [place] in the past year? 0  Number falls in past yr: 0  Injury with Fall? 0  Risk for fall due to : No Fall Risks  Follow up Falls evaluation completed   Last Audit-C alcohol use screening    11/04/2022    9:36 AM  Alcohol Use Disorder Test (AUDIT)  1. How often do you have a drink containing alcohol? 1  2. How many drinks containing alcohol do you have on a typical day when you are drinking? 0  3. How often do you have six or more drinks on one occasion? 0  AUDIT-C Score 1   A score of 3 or more in women, and 4 or more in men indicates increased risk for alcohol abuse, EXCEPT if all of the points are from question 1   No results found for any visits on 11/04/22.  Assessment & Plan    Routine Health Maintenance and Physical Exam  Exercise Activities and Dietary recommendations  Goals   None     Immunization History  Administered Date(s) Administered   Hepb-cpg 09/27/2022, 11/04/2022   Influenza,inj,Quad PF,6+ Mos 08/26/2020, 08/27/2021   PFIZER(Purple Top)SARS-COV-2 Vaccination 01/10/2020, 02/04/2020, 11/26/2020   Tdap  11/22/2020    Health Maintenance  Topic Date Due   COVID-19 Vaccine (4 - 2023-24 season) 06/25/2022   INFLUENZA VACCINE  01/23/2023 (Originally 05/25/2022)   DTaP/Tdap/Td (2 - Td or Tdap) 11/22/2030   Hepatitis C Screening  Completed   HIV Screening  Completed   HPV VACCINES  Aged Out    Discussed health benefits of physical activity, and encouraged him to engage in regular exercise appropriate for his age and condition.  Problem List Items Addressed This Visit       Digestive   Celiac disease    Discussed GF diet and cross contamination        Musculoskeletal and Integument   Rash    Declines exam today Is getting better with antifungal Desires derm referral - placed today      Relevant Orders   Ambulatory referral to Dermatology   Onychomycosis    Longstanding Discussed that we could use PO antifungal, but we need to do LFT  monitoring on this He has had issues with LFT elevation previously and decides to avoid this medication Will try OTC treatments Consider podiatry visit        Other   Obesity    Discussed importance of healthy weight management Discussed diet and exercise       Relevant Orders   Lipid Panel With LDL/HDL Ratio   Other Visit Diagnoses     Encounter for annual physical exam    -  Primary   Relevant Orders   Lipid Panel With LDL/HDL Ratio   Hepatitis B vaccination not up to date       Relevant Orders   Hepatitis B Surface AntiBODY        Return in about 1 year (around 11/05/2023) for CPE.     I, Shirlee Latch, MD, have reviewed all documentation for this visit. The documentation on 11/04/22 for the exam, diagnosis, procedures, and orders are all accurate and complete.   B[place], Marzella Schlein, MD, MPH [facility]

## 2022-11-04 ENCOUNTER — Encounter: Payer: Self-pay | Admitting: Family Medicine

## 2022-11-04 ENCOUNTER — Ambulatory Visit: Payer: Managed Care, Other (non HMO) | Admitting: Gastroenterology

## 2022-11-04 ENCOUNTER — Ambulatory Visit (INDEPENDENT_AMBULATORY_CARE_PROVIDER_SITE_OTHER): Payer: Managed Care, Other (non HMO) | Admitting: Family Medicine

## 2022-11-04 VITALS — BP 114/78 | HR 65 | Temp 98.7°F | Resp 16 | Ht 69.75 in | Wt 209.0 lb

## 2022-11-04 DIAGNOSIS — K900 Celiac disease: Secondary | ICD-10-CM

## 2022-11-04 DIAGNOSIS — E669 Obesity, unspecified: Secondary | ICD-10-CM

## 2022-11-04 DIAGNOSIS — Z0000 Encounter for general adult medical examination without abnormal findings: Secondary | ICD-10-CM | POA: Diagnosis not present

## 2022-11-04 DIAGNOSIS — Z2839 Other underimmunization status: Secondary | ICD-10-CM

## 2022-11-04 DIAGNOSIS — Z6830 Body mass index (BMI) 30.0-30.9, adult: Secondary | ICD-10-CM | POA: Diagnosis not present

## 2022-11-04 DIAGNOSIS — R21 Rash and other nonspecific skin eruption: Secondary | ICD-10-CM | POA: Insufficient documentation

## 2022-11-04 DIAGNOSIS — Z23 Encounter for immunization: Secondary | ICD-10-CM | POA: Diagnosis not present

## 2022-11-04 DIAGNOSIS — B351 Tinea unguium: Secondary | ICD-10-CM

## 2022-11-04 NOTE — Assessment & Plan Note (Signed)
Declines exam today Is getting better with antifungal Desires derm referral - placed today

## 2022-11-04 NOTE — Assessment & Plan Note (Signed)
Longstanding Discussed that we could use PO antifungal, but we need to do LFT monitoring on this He has had issues with LFT elevation previously and decides to avoid this medication Will try OTC treatments Consider podiatry visit

## 2022-11-04 NOTE — Assessment & Plan Note (Signed)
Discussed importance of healthy weight management Discussed diet and exercise  

## 2022-11-04 NOTE — Assessment & Plan Note (Addendum)
Discussed GF diet and cross contamination

## 2022-11-15 NOTE — Progress Notes (Signed)
Pt here for Immunization.

## 2023-01-29 IMAGING — US US ABDOMEN LIMITED RUQ/ASCITES
1 series · 14 of 25 positions shown · non-contrast
Comparison: None.

CLINICAL DATA: Elevated alkaline phosphatase

EXAM:
ULTRASOUND ABDOMEN LIMITED RIGHT UPPER QUADRANT

[Series 1: us abdomen limited ruq (liver/gb) · 14 of 42 slices shown]
[im 1/42]
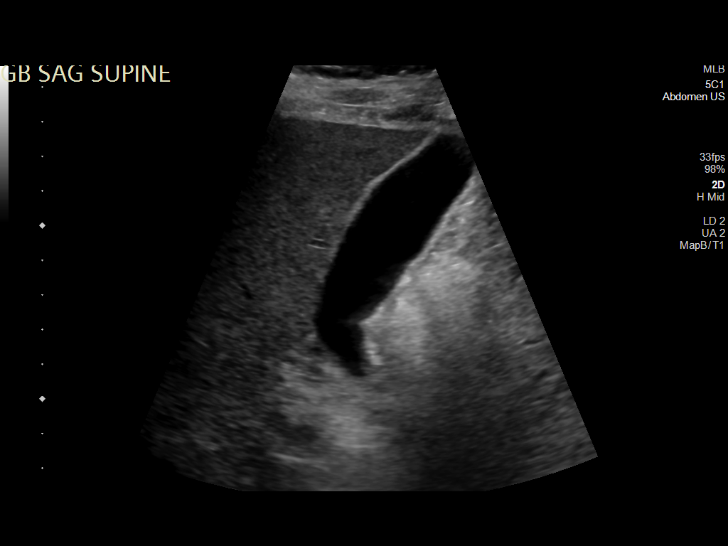
[im 4/42]
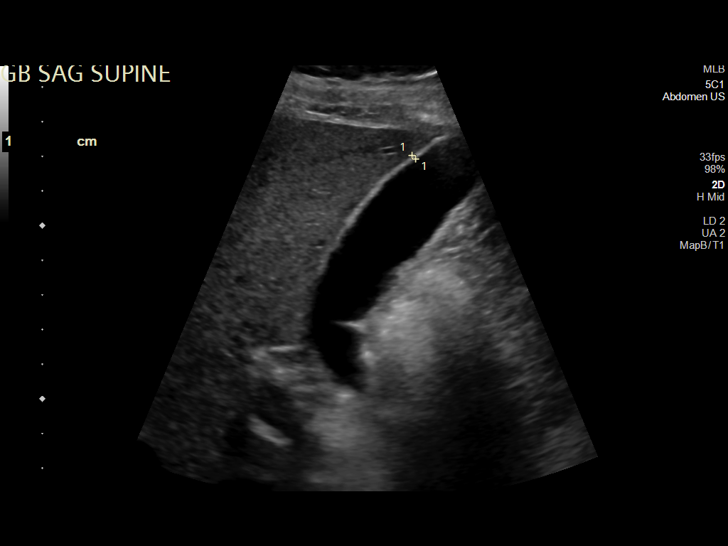
[im 7/42]
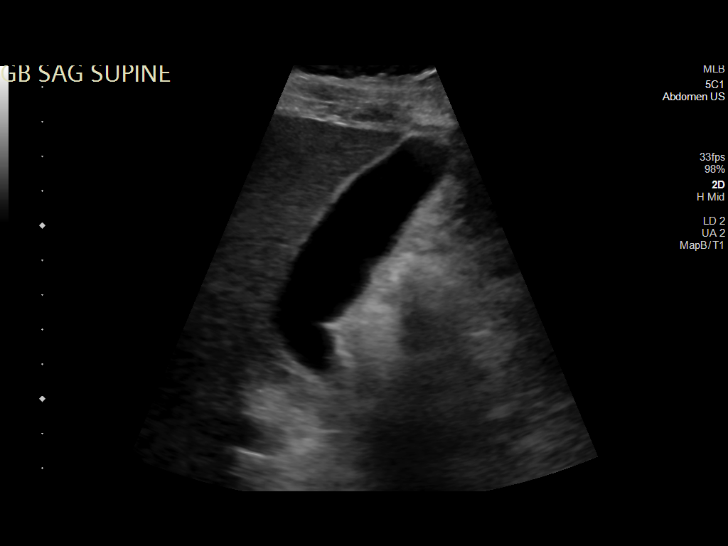
[im 11/42]
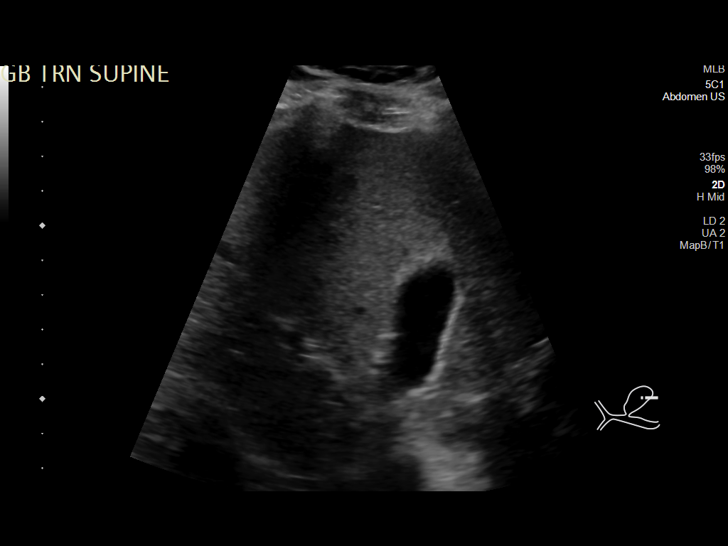
[im 14/42]
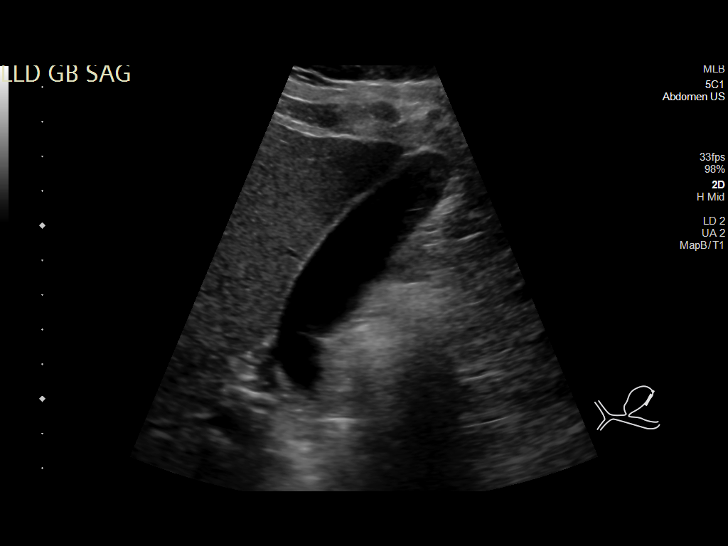
[im 16/42]
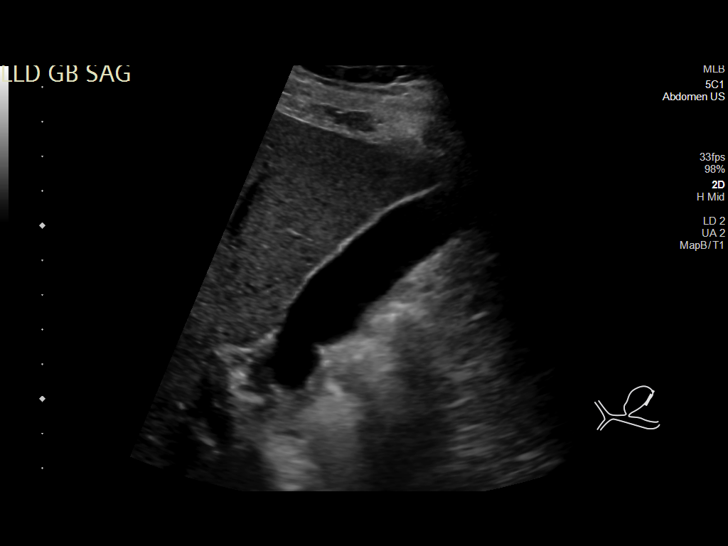
[im 19/42]
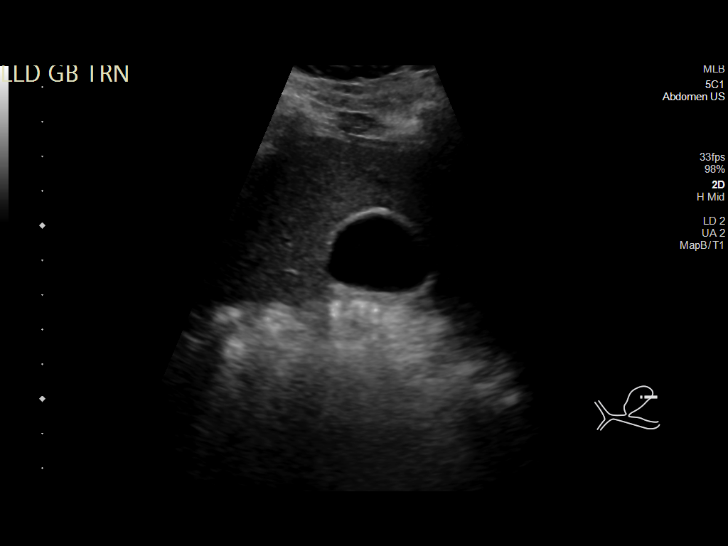
[im 23/42]
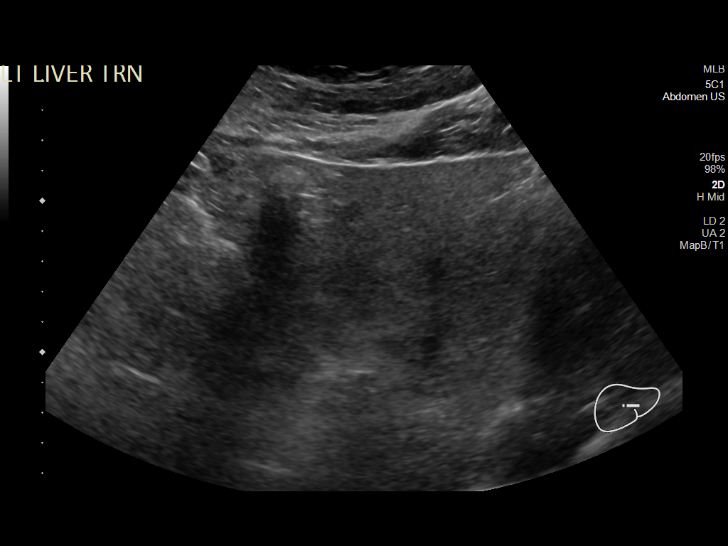
[im 26/42]
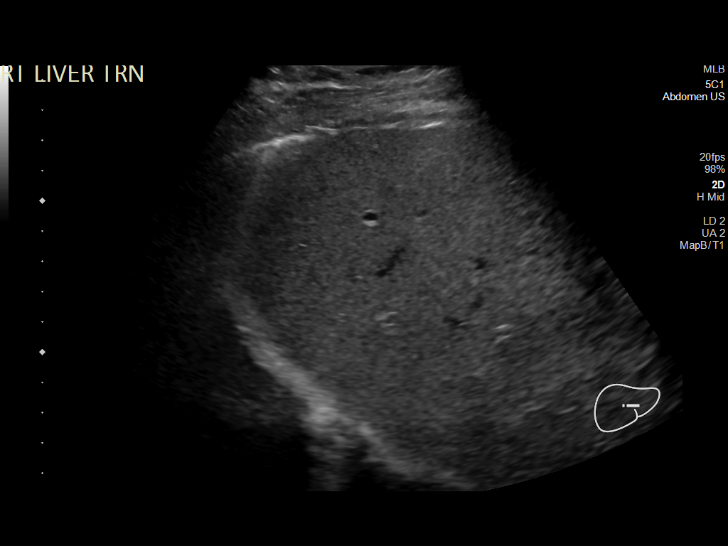
[im 28/42]
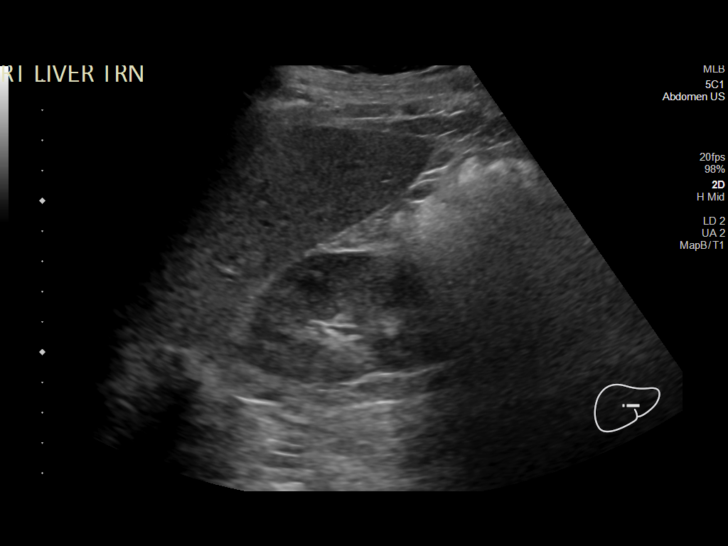
[im 31/42]
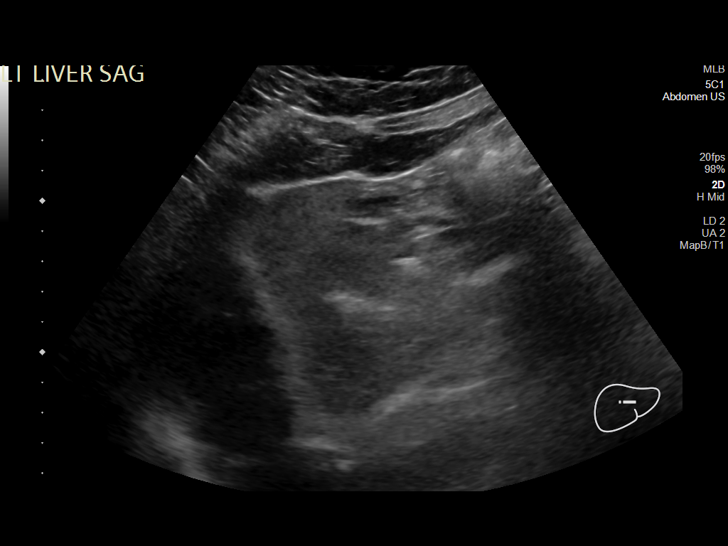
[im 35/42]
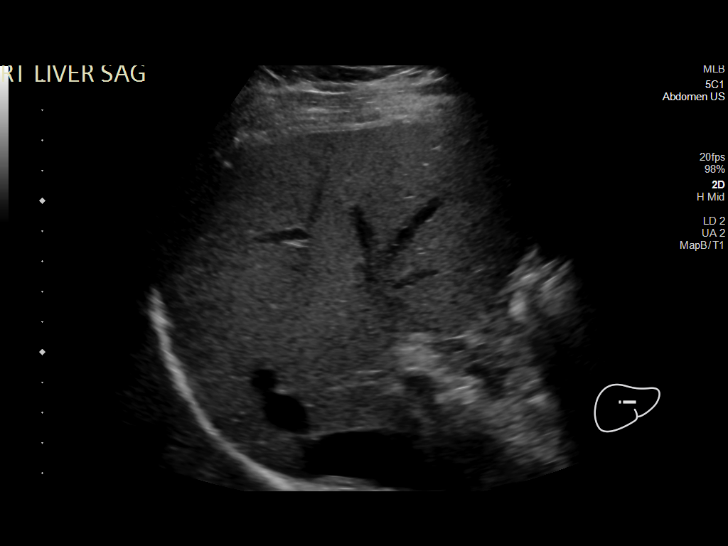
[im 38/42]
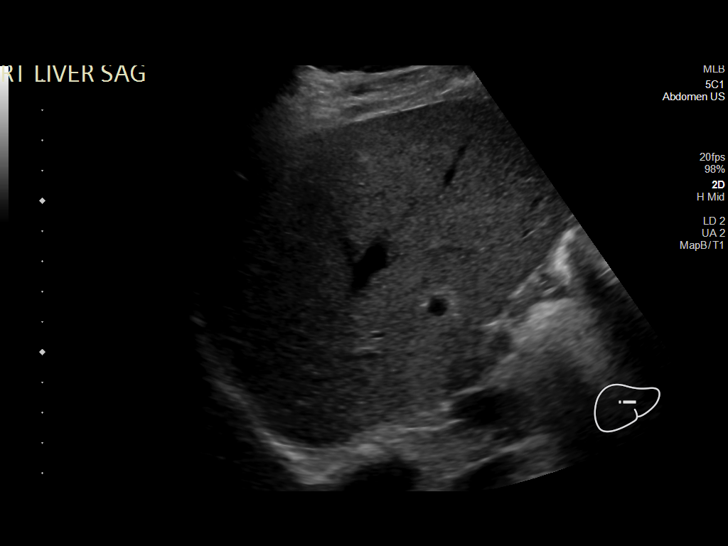
[im 42/42]
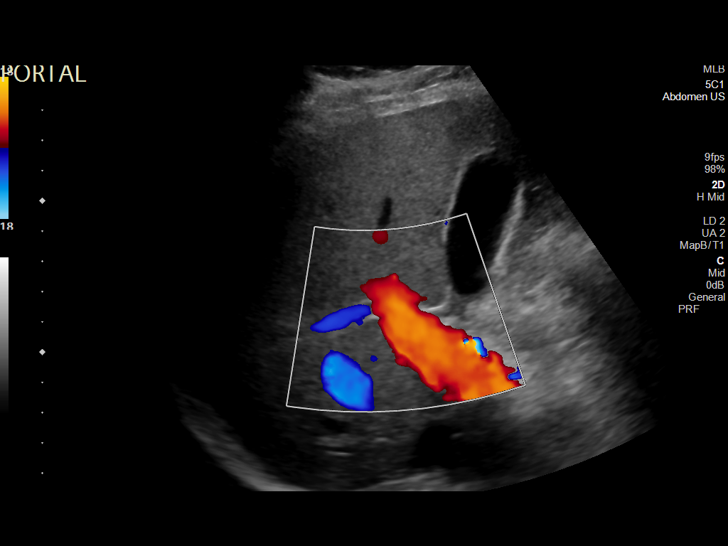

[14 of 25 positions shown; findings below may reference images not displayed]

FINDINGS: Gallbladder:

No gallstones or wall thickening visualized. No sonographic Murphy
sign noted by sonographer.

Common bile duct:

Diameter: 2.2 mm

Liver:

No focal lesion identified. Within normal limits in parenchymal
echogenicity. Portal vein is patent on color Doppler imaging with
normal direction of blood flow towards the liver.

Other: None.
IMPRESSION: Normal study. No cause for the patient's elevated alkaline
phosphatase identified.

## 2023-03-09 ENCOUNTER — Encounter: Payer: Self-pay | Admitting: Gastroenterology

## 2023-03-10 NOTE — Telephone Encounter (Signed)
Yes will have to go through pcp , we can only order for established adult patients

## 2023-03-25 ENCOUNTER — Encounter: Payer: Self-pay | Admitting: Gastroenterology

## 2023-03-29 ENCOUNTER — Ambulatory Visit (INDEPENDENT_AMBULATORY_CARE_PROVIDER_SITE_OTHER): Payer: Managed Care, Other (non HMO) | Admitting: Gastroenterology

## 2023-03-29 DIAGNOSIS — Z23 Encounter for immunization: Secondary | ICD-10-CM | POA: Diagnosis not present

## 2023-03-29 DIAGNOSIS — R748 Abnormal levels of other serum enzymes: Secondary | ICD-10-CM

## 2023-03-29 DIAGNOSIS — K900 Celiac disease: Secondary | ICD-10-CM

## 2023-03-29 NOTE — Progress Notes (Signed)
Gave Hepatitis B in Left deltoid patient tolerated the procedure okay with no side effects

## 2023-03-30 LAB — HEPATIC FUNCTION PANEL
ALT: 27 IU/L (ref 0–44)
AST: 22 IU/L (ref 0–40)
Albumin: 4.2 g/dL (ref 4.1–5.1)
Alkaline Phosphatase: 117 IU/L (ref 44–121)
Bilirubin Total: 0.4 mg/dL (ref 0.0–1.2)
Bilirubin, Direct: <0.1 mg/dL (ref 0.00–0.40)
Total Protein: 7 g/dL (ref 6.0–8.5)

## 2023-03-30 LAB — VITAMIN D 25 HYDROXY (VIT D DEFICIENCY, FRACTURES): Vit D, 25-Hydroxy: 38.2 ng/mL (ref 30.0–100.0)

## 2023-06-09 ENCOUNTER — Encounter (INDEPENDENT_AMBULATORY_CARE_PROVIDER_SITE_OTHER): Payer: Managed Care, Other (non HMO) | Admitting: Family Medicine

## 2023-06-09 DIAGNOSIS — G44209 Tension-type headache, unspecified, not intractable: Secondary | ICD-10-CM | POA: Diagnosis not present

## 2023-06-09 DIAGNOSIS — N50819 Testicular pain, unspecified: Secondary | ICD-10-CM

## 2023-06-09 NOTE — Telephone Encounter (Signed)
Please see the MyChart message reply(ies) for my assessment and plan.    This patient gave consent for this Medical Advice Message and is aware that it may result in a bill to [employer], as well as the possibility of receiving a bill for a co-payment or deductible. They are an established patient, but are not seeking medical advice exclusively about a problem treated during an in person or video visit in the last seven days. I did not recommend an in person or video visit within seven days of my reply.    I spent a total of 12 minutes cumulative time within 7 days through [employer].  Shirlee Latch, MD

## 2023-06-21 ENCOUNTER — Encounter: Payer: Self-pay | Admitting: Family Medicine

## 2023-06-21 ENCOUNTER — Ambulatory Visit (INDEPENDENT_AMBULATORY_CARE_PROVIDER_SITE_OTHER): Payer: Managed Care, Other (non HMO) | Admitting: Family Medicine

## 2023-06-21 VITALS — BP 123/75 | HR 59 | Temp 97.6°F | Resp 12 | Ht 69.0 in | Wt 215.0 lb

## 2023-06-21 DIAGNOSIS — G4440 Drug-induced headache, not elsewhere classified, not intractable: Secondary | ICD-10-CM | POA: Insufficient documentation

## 2023-06-21 DIAGNOSIS — R519 Headache, unspecified: Secondary | ICD-10-CM

## 2023-06-21 MED ORDER — PREDNISONE 10 MG PO TABS: ORAL_TABLET | ORAL | 0 refills | Status: DC

## 2023-06-21 NOTE — Assessment & Plan Note (Signed)
Acute  Has been using ibuprofen continuously for headache  Take methylprednisolone if headache not improved within a couple of days Advised patient about side effects and to take medicine in the morning with food

## 2023-06-21 NOTE — Assessment & Plan Note (Signed)
Acute  Likely a tension headache  Use heating back  Take methylprednisolone pack if headache not improved in a couple of days

## 2023-06-21 NOTE — Progress Notes (Signed)
Established Patient Office Visit  Subjective   Patient ID: Lance Bennett, male    DOB: 05/08/1981  Age: 42 y.o. MRN: [id number]  Chief Complaint  Patient presents with   Headache    Lance Bennett is here today with a 2 week history of bilateral headache. The headache started in the occipital region and has migrated towards the front of his head. He denies nausea and light or sound sensitivity. He denies cold like symptoms or congestion. He does not have a history of headaches so is worried. Has been staying hydrated, sleeping well, and is not stressed.  Has constellation of various symptoms before this. Assured him they were unrelated.  Headache  Pertinent negatives include no blurred vision, ear pain, eye pain, fever, hearing loss or neck pain.   Review of Systems  Constitutional:  Negative for chills, fever and malaise/fatigue.  HENT:  Negative for congestion, ear pain and hearing loss.   Eyes:  Negative for blurred vision and pain.  Musculoskeletal:  Negative for joint pain, myalgias and neck pain.  Neurological:  Positive for headaches. Negative for sensory change, speech change, focal weakness and loss of consciousness.     Objective:     BP 123/75 (BP Location: Left Arm, Patient Position: Sitting, Cuff Size: Large)   Pulse (!) 59   Temp 97.6 F (36.4 C) (Temporal)   Resp 12   Ht 5\' 9"  (1.753 m)   Wt 215 lb (97.5 kg)   SpO2 99%   BMI 31.75 kg/m   Physical Exam Constitutional:      Appearance: He is well-developed.  HENT:     Head: Normocephalic and atraumatic.  Eyes:     Extraocular Movements: Extraocular movements intact.     Pupils: Pupils are equal, round, and reactive to light.  Musculoskeletal:     Right shoulder: No tenderness.     Left shoulder: No tenderness.     Cervical back: Normal range of motion and neck supple. No rigidity.  Lymphadenopathy:     Cervical: No cervical adenopathy.  Neurological:     Mental Status: He is alert and oriented to person,  place, and time.     Cranial Nerves: Cranial nerves 2-12 are intact.     Sensory: Sensation is intact.     Motor: Motor function is intact.     Coordination: Coordination is intact.     Gait: Gait is intact.   No results found for any visits on 06/21/23.  The 10-year ASCVD risk score (Arnett DK, et al., 2019) is: 1.2%    Assessment & Plan:   Problem List Items Addressed This Visit       Other   Bilateral headaches - Primary    Acute  Likely a tension headache  Use heating back  Take methylprednisolone pack if headache not improved in a couple of days      Rebound headache    Acute  Has been using ibuprofen continuously for headache  Take methylprednisolone if headache not improved within a couple of days Advised patient about side effects and to take medicine in the morning with food       Meds ordered this encounter  Medications   predniSONE (DELTASONE) 10 MG tablet    Sig: Take 60mg  PO daily x1d, then 50mg  daily x1d, then 40mg  daily x1d, then 30mg  daily x1d, then 20mg  daily x1d, then 10mg  daily x1d, then stop    Dispense:  21 tablet    Refill:  0  Return in about 5 months (around 11/21/2023) for CPE.    Rometta Emery, Medical Student  Patient seen along with MS3 student Lance Bennett. I personally evaluated this patient along with the student, and verified all aspects of the history, physical exam, and medical decision making as documented by the student. I agree with the student's documentation and have made all necessary edits.  Lance Bennett, Marzella Schlein, MD, MPH [facility]

## 2023-09-23 ENCOUNTER — Encounter: Payer: Self-pay | Admitting: Family Medicine

## 2023-09-28 ENCOUNTER — Encounter: Payer: Self-pay | Admitting: Family Medicine

## 2023-09-29 ENCOUNTER — Telehealth: Payer: Managed Care, Other (non HMO) | Admitting: Family Medicine

## 2023-09-29 ENCOUNTER — Encounter: Payer: Self-pay | Admitting: Family Medicine

## 2023-09-29 DIAGNOSIS — L237 Allergic contact dermatitis due to plants, except food: Secondary | ICD-10-CM

## 2023-09-29 MED ORDER — PREDNISONE 10 MG PO TABS
ORAL_TABLET | ORAL | 0 refills | Status: AC
Start: 2023-09-29 — End: 2023-10-11

## 2023-09-29 NOTE — Patient Instructions (Signed)
POISON IVY TREATMENT  Poison ivy dermatitis usually resolves within one to three weeks without treatment. Treatments that may help relieve the itching, soreness, and discomfort caused by poison ivy dermatitis include:  Skin treatments -- For some people, adding oatmeal to a bath, applying cool wet compresses, and applying calamine lotion may help to relieve itching. Once the blisters begin weeping fluid, astringents containing aluminum acetate (Burow's solution) and [place]omeboro may help to relieve the rash.  Antihistamines -- Antihistamines do not help to relieve itching caused by poison ivy dermatitis. Some antihistamines make you sleepy while others do not. The ones that make you sleepy (eg, diphenhydramine [sample brand name Benadryl]) can help you to ignore the itch while sleeping, but the quality of sleeping is worse than normal, and patients scratch just as much during the night as if they were not taking an antihistamine.  Steroid creams -- Steroid creams may be helpful if they are used during the first few days after symptoms develop. Low-potency steroid creams, such as 1% hydrocortisone (available in the [place] without prescription) are not usually helpful. A stronger prescription formula may be helpful, but such steroid creams cost more and are less helpful than taking steroid pills or receiving an injection.  Steroid pills or injections -- If you develop severe symptoms or the rash covers a large area (especially on the face or genitals), you may need steroid pills (eg, prednisone) or injections (eg, triamcinolone acetonide, budesonide) to help relieve itching and swelling. Pills are usually given for 14 to 21 days, with the dosage slowly decreased over time. When pills are stopped sooner than 14 days, it is common for the rash and itching to reappear.  Antibiotics -- Skin infections are a potential complication of poison ivy, especially if you scratch your skin. If you develop a skin  infection because of poison ivy dermatitis, you may need antibiotics to treat the infection. Do not use over-the-counter topical antibiotic creams; many bacteria are resistant to them and they are one of the chief causes of allergic contact dermatitis not caused by plants.  Other treatments -- You should not use antihistamine creams or lotions, anesthetic creams containing benzocaine, or antibiotic creams containing neomycin or bacitracin to the skin. These creams or ointments could make the rash worse.  POISON IVY RASH PREVENTION  The best way to prevent poison ivy dermatitis is to identify and avoid the plants that cause it. These plants can irritate the skin year round, even during the winter months, and they can still cause a reaction after dying.  ?Wear protective clothing, including long sleeves and pants, when working in areas where toxic plants may be found. Keep in mind that the resin and oils from the toxic plants can be carried on clothing, pets, and under fingernails.  ?Wear heavy-duty vinyl gloves when doing yard work or gardening. The oils from toxic plants can seep through latex or rubber gloves.  ?After coming in contact with poison ivy, remove any contaminated clothing. As soon as possible (minutes count, but you can try up to two hours later), wash under very warm or hot running water using dishwashing liquid on a damp washcloth. Wash your entire body three times, while always wiping in one direction and not back and forth. This seems to reduce irritation and help remove the oils. If you do not have rapid access to dishwashing liquid, try to use plain water and wipe your skin in the same fashion; you will at least get rid of some  of the resin. Comparison of dishwashing liquid with more expensive products made for removing poison ivy oils did not show a difference in effectiveness.  ?Creams and ointments that create a barrier between the skin and the urushiol oil may be somewhat  effective for people who are frequently exposed to poison ivy.  ?Avoid burning poisonous vegetation, which can disperse the plant particles in the smoke, irritate the skin, and cause poison ivy dermatitis.

## 2023-09-29 NOTE — Progress Notes (Signed)
MyChart Video Visit    Virtual Visit via Video Note   This format is felt to be most appropriate for this patient at this time. Physical exam was limited by quality of the video and audio technology used for the visit.   Patient location: [place], [place] Provider location: [facility]  I discussed the limitations of evaluation and management by telemedicine and the availability of in person appointments. The patient expressed understanding and agreed to proceed.  Patient: Lance Bennett   DOB: [birth year]   42 y.o. Male  MRN: [id number] Visit Date: 09/29/2023  Today's healthcare provider: Sherlyn Hay, DO   No chief complaint on file.  Subjective    HPI  Lance Bennett presents with a worsening rash suspected to be poison ivy. The rash onset was three days ago, following a day of tree cutting, which was covered in a significant amount of vines. Initially, the patient experienced itchiness around the eyes, which progressed to a visible rash on the arm, neck, and lower back. Over the past few days, the rash has worsened, becoming increasingly itchy and bothersome.  The patient has a history of poison ivy reactions and has previously tried topical treatments, including steroid creams and calamine lotion. However, these treatments have historically exacerbated the itchiness and caused pain, leading the patient to avoid using them for this current episode. The patient has not started any new medications for this current episode.  The rash is extensive, covering the entirety of the back of the arm and elbow, and has caused the skin to feel tight. The patient also reports itchiness extending down the neck, as well as on an area of his lower back.    Medications: Outpatient Medications Prior to Visit  Medication Sig   colchicine 0.6 MG tablet Take 1 tablet (0.6 mg total) by mouth daily.   [DISCONTINUED] predniSONE (DELTASONE) 10 MG tablet Take 60mg  PO daily x1d, then  50mg  daily x1d, then 40mg  daily x1d, then 30mg  daily x1d, then 20mg  daily x1d, then 10mg  daily x1d, then stop   No facility-administered medications prior to visit.        Objective    There were no vitals taken for this visit.      Physical Exam Constitutional:      General: He is not in acute distress.    Appearance: Normal appearance. He is not diaphoretic.  HENT:     Head: Normocephalic.     Comments: Erythema and a couple small papules under the eye and on the left side of the neck. Eyes:     Conjunctiva/sclera: Conjunctivae normal.  Pulmonary:     Effort: Pulmonary effort is normal. No respiratory distress.  Skin:    Comments: Erythema and a large area of confluent papules on the posterior aspect of the right arm.  Neurological:     Mental Status: He is alert and oriented to person, place, and time. Mental status is at baseline.      Assessment & Plan    Poison ivy dermatitis -     predniSONE; 6 tablets for 3 days, then 5 for 3 days, then 4 for 3 days, then 3 for 3 days, then 2 for 3 days, then 1 for 3 days.  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 Symptoms began on Saturday with pruritus around the eyes, followed by a rash on the arm, neck, and lower back.  Condition has worsened with increased pruritus and discomfort. Oral steroids were chosen over topical treatments due to adverse reactions to topicals and patient preference for oral medication. - Prescribe oral steroid with a longer taper - Send prescription to [facility] on [address] in [place].  Return if symptoms worsen or fail to improve.     I discussed the assessment and treatment plan with the patient. The patient was provided an opportunity to ask questions and all were answered. The patient agreed with the plan and demonstrated an understanding of the instructions.   The patient  was advised to call back or seek an in-person evaluation if the symptoms worsen or if the condition fails to improve as anticipated.  I provided 6 minutes of virtual-face-to-face time during this encounter.   Sherlyn Hay, DO [facility] [phone] (phone) [phone] (fax)  [facility]

## 2023-10-10 ENCOUNTER — Encounter: Payer: Self-pay | Admitting: Family Medicine

## 2023-10-31 ENCOUNTER — Ambulatory Visit (INDEPENDENT_AMBULATORY_CARE_PROVIDER_SITE_OTHER): Payer: Managed Care, Other (non HMO) | Admitting: Dermatology

## 2023-10-31 DIAGNOSIS — L409 Psoriasis, unspecified: Secondary | ICD-10-CM | POA: Diagnosis not present

## 2023-10-31 DIAGNOSIS — Z79899 Other long term (current) drug therapy: Secondary | ICD-10-CM

## 2023-10-31 DIAGNOSIS — B360 Pityriasis versicolor: Secondary | ICD-10-CM

## 2023-10-31 DIAGNOSIS — L603 Nail dystrophy: Secondary | ICD-10-CM

## 2023-10-31 DIAGNOSIS — Z7189 Other specified counseling: Secondary | ICD-10-CM

## 2023-10-31 MED ORDER — KETOCONAZOLE 2 % EX SHAM: 1.0000 | MEDICATED_SHAMPOO | CUTANEOUS | 11 refills | Status: DC

## 2023-10-31 MED ORDER — TRIAMCINOLONE ACETONIDE 0.1 % EX CREA: 1.0000 | TOPICAL_CREAM | CUTANEOUS | 2 refills | Status: DC

## 2023-10-31 NOTE — Progress Notes (Signed)
 New Patient Visit   Subjective  Lance Bennett is a 43 y.o. male who presents for the following: Rash R ankle 15-20 yrs comes and goes, otc lotion, white spots post neck ~61yrs uses a sal acid soap that helps, red rash gets on neck, hx of poison ivy that pat was on oral prednisone  and he got a few patches that were rad and scaly and not sure if allergic to prednisone  comes and goes, feet get scaly uses otc anti fungal cream, no fhx of psoriasis The patient has spots, moles and lesions to be evaluated, some may be new or changing and the patient may have concern these could be cancer.   The following portions of the chart were reviewed this encounter and updated as appropriate: medications, allergies, medical history  Review of Systems:  No other skin or systemic complaints except as noted in HPI or Assessment and Plan.  Objective  Well appearing patient in no apparent distress; mood and affect are within normal limits.   A focused examination was performed of the following areas: Feet, ankles, trunk, neck  Relevant exam findings are noted in the Assessment and Plan.                  Assessment & Plan   PSORIASIS Feet, toenails, elbows, buttocks Exam: scaling elbows R > L, scaliness and pinkness feet, severe toenail dystrophy 12% BSA.  Chronic and persistent condition with duration or expected duration over one year. Condition is bothersome/symptomatic for patient. Currently flared.  patient denies joint pain  Psoriasis is a chronic non-curable, but treatable genetic/hereditary disease that may have other systemic features affecting other organ systems such as joints (Psoriatic Arthritis). It is associated with an increased risk of inflammatory bowel disease, heart disease, non-alcoholic fatty liver disease, and depression.  Treatments include light and laser treatments; topical medications; and systemic medications including oral and injectables.  Treatment  Plan: Discussed [place], pt would like to research first Start TMC 0.1% cr hs 5d/wk to elbows, feet and toenails prn flares, avoid f/g/a Molecular Nail culture today  Topical steroids (such as triamcinolone , fluocinolone, fluocinonide, mometasone, clobetasol, halobetasol, betamethasone, hydrocortisone) can cause thinning and lightening of the skin if they are used for too long in the same area. Your physician has selected the right strength medicine for your problem and area affected on the body. Please use your medication only as directed by your physician to prevent side effects.     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 Condition is bothersome/symptomatic for patient. Currently flared.  Use ketoconazole  shampoo as body wash 2-3 times a week for 2 months, then once a month for maintenance, let sit 5 minutes and rinse out   Tinea versicolor is a chronic recurrent skin rash causing discolored scaly spots most commonly seen on back, chest, and/or shoulders.  It is generally asymptomatic. The rash is due to overgrowth of a common type of yeast present on everyone's skin and it is not contagious.  It tends to flare more in the summer due to increased sweating on trunk.  After rash is treated, the scaliness will resolve, but the discoloration will take longer to return to normal pigmentation. The periodic use of an OTC medicated soap/shampoo with zinc  or selenium sulfide can be helpful to prevent yeast overgrowth and recurrence.   Long term medication management.  Patient is using long term (months to years) prescription medication  to control their  dermatologic condition.  These medications require periodic monitoring to evaluate for efficacy and side effects and may require periodic laboratory monitoring.    Return in about 3 months (around 01/29/2024) for psoriasis f/u, TV f/u.  I, Grayce Saunas, RMA, am acting as scribe for Alm Rhyme, MD .   Documentation: I have reviewed the above documentation for accuracy and completeness, and I agree with the above.  Alm Rhyme, MD

## 2023-10-31 NOTE — Patient Instructions (Signed)

## 2023-11-02 ENCOUNTER — Ambulatory Visit: Payer: Managed Care, Other (non HMO) | Admitting: Dermatology

## 2023-11-09 ENCOUNTER — Encounter: Payer: Self-pay | Admitting: Dermatology

## 2023-11-14 ENCOUNTER — Encounter: Payer: Self-pay | Admitting: Dermatology

## 2023-11-22 ENCOUNTER — Ambulatory Visit (INDEPENDENT_AMBULATORY_CARE_PROVIDER_SITE_OTHER): Payer: Managed Care, Other (non HMO) | Admitting: Family Medicine

## 2023-11-22 ENCOUNTER — Encounter: Payer: Self-pay | Admitting: Family Medicine

## 2023-11-22 VITALS — BP 114/83 | HR 64 | Ht 69.0 in | Wt 218.7 lb

## 2023-11-22 DIAGNOSIS — L409 Psoriasis, unspecified: Secondary | ICD-10-CM | POA: Diagnosis not present

## 2023-11-22 DIAGNOSIS — Z0001 Encounter for general adult medical examination with abnormal findings: Secondary | ICD-10-CM

## 2023-11-22 DIAGNOSIS — K900 Celiac disease: Secondary | ICD-10-CM | POA: Diagnosis not present

## 2023-11-22 DIAGNOSIS — Z23 Encounter for immunization: Secondary | ICD-10-CM

## 2023-11-22 DIAGNOSIS — Z0000 Encounter for general adult medical examination without abnormal findings: Secondary | ICD-10-CM

## 2023-11-22 NOTE — Assessment & Plan Note (Signed)
Well-managed condition. Routine labs to monitor vitamin levels and overall health. - Order thyroid panel, CBC, iron panel, vitamin D, A1c, kidney and liver function tests, and lipid profile

## 2023-11-22 NOTE — Assessment & Plan Note (Signed)
Psoriasis confirmed by dermatologist, affecting feet, ankles, and buttocks. Discussed potential future use of biologics if condition worsens. Patient concerned about immune suppression risks but reassured. Biologics can be administered at home, typically monthly or bi-weekly. - Continue using prescribed cream - Consider biologics if condition worsens per Derm recommendations - no current S/S of PsA

## 2023-11-22 NOTE — Progress Notes (Signed)
Complete physical exam   Patient: Lance Bennett   DOB: 1981/06/02   43 y.o. Male  MRN: [id number] Visit Date: 11/22/2023  Today's healthcare provider: Shirlee Latch, MD   Chief Complaint  Patient presents with   Annual Exam    Last completed 11/04/2022 Diet - Zero gluten Exercise - daily cutting fire wood Feeling - Well Sleeping - Well  Concerns - discuss prednisone and psorasis   Medication Reaction    Prednisone. Reports taking for poision ivy and 3 days into taking it he had red spots on body that he wanted to discuss and have provider input   Subjective    Lance Bennett is a 43 y.o. male who presents today for a complete physical exam.    Discussed the use of AI scribe software for clinical note transcription with the patient, who gave verbal consent to proceed.  History of Present Illness   The patient, with a history of celiac disease and psoriasis, presents with concerns about various health issues. The patient reports a recent fall resulting in a head injury, but denies any lingering symptoms or complications from the incident. The patient also discusses a history of poison ivy and a reaction to prednisone, which included a rash and itchiness. The patient reports that the rash has persisted for two months and has been intermittently itchy. The patient also mentions that they have been using a cream for their psoriasis, which has been effective in managing their symptoms. The patient also reports having psoriasis on their feet and ankles, which has been confirmed by a dermatologist. The patient is considering the use of shots for treatment if the cream does not provide sufficient relief. The patient also mentions experiencing joint pain, particularly in the knuckles and knees, and is curious if this could be related to psoriatic arthritis.        Last depression screening scores    11/22/2023    9:21 AM 06/21/2023    9:48 AM 11/04/2022    9:36 AM  PHQ 2/9  Scores  PHQ - 2 Score 0 0 0  PHQ- 9 Score  0 0   Last fall risk screening    11/22/2023    9:21 AM  Fall Risk   Falls in the past year? 1  Number falls in past yr: 0  Injury with Fall? 0  Risk for fall due to : No Fall Risks  Follow up Falls evaluation completed        Medications: Outpatient Medications Prior to Visit  Medication Sig   colchicine 0.6 MG tablet Take 1 tablet (0.6 mg total) by mouth daily.   ketoconazole (NIZORAL) 2 % shampoo Apply 1 Application topically as directed. wash trunk 3 times a week for 2 months, then once a month for maintenance, let sit 5 minutes and rinse off, for Tinea versicolor   triamcinolone cream (KENALOG) 0.1 % Apply 1 Application topically as directed. Qd up to 5 days per week affected area psoriasis buttocks, elbows, feet, toenails until clear, then prn flares, avoid face, groin, axilla   No facility-administered medications prior to visit.    Review of Systems    Objective    BP 114/83 (BP Location: Left Arm, Patient Position: Sitting, Cuff Size: Normal)   Pulse 64   Ht 5\' 9"  (1.753 m)   Wt 218 lb 11.2 oz (99.2 kg)   SpO2 (!) 10%   BMI 32.30 kg/m    Physical Exam Vitals reviewed.  Constitutional:  General: He is not in acute distress.    Appearance: Normal appearance. He is well-developed. He is not diaphoretic.  HENT:     Head: Normocephalic and atraumatic.     Right Ear: Tympanic membrane, ear canal and external ear normal.     Left Ear: Tympanic membrane, ear canal and external ear normal.     Nose: Nose normal.     Mouth/Throat:     Mouth: Mucous membranes are moist.     Pharynx: Oropharynx is clear. No oropharyngeal exudate.  Eyes:     General: No scleral icterus.    Conjunctiva/sclera: Conjunctivae normal.     Pupils: Pupils are equal, round, and reactive to light.  Neck:     Thyroid: No thyromegaly.  Cardiovascular:     Rate and Rhythm: Normal rate and regular rhythm.     Heart sounds: Normal heart  sounds. No murmur heard. Pulmonary:     Effort: Pulmonary effort is normal. No respiratory distress.     Breath sounds: Normal breath sounds. No wheezing or rales.  Abdominal:     General: There is no distension.     Palpations: Abdomen is soft.     Tenderness: There is no abdominal tenderness.  Musculoskeletal:        General: No deformity.     Cervical back: Neck supple.     Right lower leg: No edema.     Left lower leg: No edema.  Lymphadenopathy:     Cervical: No cervical adenopathy.  Skin:    General: Skin is warm and dry.     Findings: Rash present.  Neurological:     Mental Status: He is alert and oriented to person, place, and time. Mental status is at baseline.     Gait: Gait normal.  Psychiatric:        Mood and Affect: Mood normal.        Behavior: Behavior normal.        Thought Content: Thought content normal.      No results found for any visits on 11/22/23.  Assessment & Plan    Routine Health Maintenance and Physical Exam  Exercise Activities and Dietary recommendations  Goals   None     Immunization History  Administered Date(s) Administered   Hepb-cpg 09/27/2022, 11/04/2022, 03/29/2023   Influenza, Seasonal, Injecte, Preservative Fre 11/22/2023   Influenza,inj,Quad PF,6+ Mos 08/26/2020, 08/27/2021   PFIZER(Purple Top)SARS-COV-2 Vaccination 01/10/2020, 02/04/2020, 11/26/2020   Tdap 11/22/2020    Health Maintenance  Topic Date Due   COVID-19 Vaccine (4 - 2024-25 season) 06/26/2023   DTaP/Tdap/Td (2 - Td or Tdap) 11/22/2030   INFLUENZA VACCINE  Completed   Hepatitis C Screening  Completed   HIV Screening  Completed   HPV VACCINES  Aged Out    Discussed health benefits of physical activity, and encouraged him to engage in regular exercise appropriate for his age and condition.  Problem List Items Addressed This Visit       Digestive   Celiac disease   Well-managed condition. Routine labs to monitor vitamin levels and overall health. -  Order thyroid panel, CBC, iron panel, vitamin D, A1c, kidney and liver function tests, and lipid profile      Relevant Orders   TSH   Iron, TIBC and Ferritin Panel   CBC with Differential/Platelet   Hemoglobin A1c   Comprehensive metabolic panel   Lipid panel   VITAMIN D 25 Hydroxy (Vit-D Deficiency, Fractures)     Musculoskeletal and Integument  Psoriasis   Psoriasis confirmed by dermatologist, affecting feet, ankles, and buttocks. Discussed potential future use of biologics if condition worsens. Patient concerned about immune suppression risks but reassured. Biologics can be administered at home, typically monthly or bi-weekly. - Continue using prescribed cream - Consider biologics if condition worsens per Derm recommendations - no current S/S of PsA      Other Visit Diagnoses       Encounter for annual physical exam    -  Primary   Relevant Orders   TSH   Iron, TIBC and Ferritin Panel   CBC with Differential/Platelet   Hemoglobin A1c   Comprehensive metabolic panel   Lipid panel   VITAMIN D 25 Hydroxy (Vit-D Deficiency, Fractures)     Flu vaccine need       Relevant Orders   Flu vaccine trivalent PF, 6mos and older(Flulaval,Afluria,Fluarix,Fluzone) (Completed)           General Health Maintenance Due for routine health maintenance. Discussed flu vaccination and colonoscopy screening. Flu shot recommended due to late flu season and potential risk reduction. Colonoscopy recommended by age 57 due to increased risk of colon cancer in younger populations. - Administer flu shot - Schedule colonoscopy by age 48  Follow-up - Schedule annual physical in one year - Review lab results and follow up as needed.       Return in about 1 year (around 11/21/2024) for CPE.     Shirlee Latch, MD  [facility] [phone] (phone) [phone] (fax)  [facility]

## 2023-11-23 LAB — HEMOGLOBIN A1C
Est. average glucose Bld gHb Est-mCnc: 126 mg/dL
Hgb A1c MFr Bld: 6 % — ABNORMAL HIGH (ref 4.8–5.6)

## 2023-11-23 LAB — LIPID PANEL
Chol/HDL Ratio: 4.3 {ratio} (ref 0.0–5.0)
Cholesterol, Total: 165 mg/dL (ref 100–199)
HDL: 38 mg/dL — ABNORMAL LOW (ref >39)
LDL Chol Calc (NIH): 104 mg/dL — ABNORMAL HIGH (ref 0–99)
Triglycerides: 127 mg/dL (ref 0–149)
VLDL Cholesterol Cal: 23 mg/dL (ref 5–40)

## 2023-11-23 LAB — CBC WITH DIFFERENTIAL/PLATELET
Basophils Absolute: 0.1 10*3/uL (ref 0.0–0.2)
Basos: 1 %
EOS (ABSOLUTE): 0.3 10*3/uL (ref 0.0–0.4)
Eos: 4 %
Hematocrit: 43.1 % (ref 37.5–51.0)
Hemoglobin: 14.2 g/dL (ref 13.0–17.7)
Immature Grans (Abs): 0 10*3/uL (ref 0.0–0.1)
Immature Granulocytes: 0 %
Lymphocytes Absolute: 3.2 10*3/uL — ABNORMAL HIGH (ref 0.7–3.1)
Lymphs: 39 %
MCH: 27 pg (ref 26.6–33.0)
MCHC: 32.9 g/dL (ref 31.5–35.7)
MCV: 82 fL (ref 79–97)
Monocytes Absolute: 0.7 10*3/uL (ref 0.1–0.9)
Monocytes: 8 %
Neutrophils Absolute: 4.1 10*3/uL (ref 1.4–7.0)
Neutrophils: 48 %
Platelets: 279 10*3/uL (ref 150–450)
RBC: 5.25 x10E6/uL (ref 4.14–5.80)
RDW: 13.3 % (ref 11.6–15.4)
WBC: 8.3 10*3/uL (ref 3.4–10.8)

## 2023-11-23 LAB — COMPREHENSIVE METABOLIC PANEL
ALT: 25 [IU]/L (ref 0–44)
AST: 20 [IU]/L (ref 0–40)
Albumin: 4.2 g/dL (ref 4.1–5.1)
Alkaline Phosphatase: 109 [IU]/L (ref 44–121)
BUN/Creatinine Ratio: 14 (ref 9–20)
BUN: 13 mg/dL (ref 6–24)
Bilirubin Total: 0.4 mg/dL (ref 0.0–1.2)
CO2: 25 mmol/L (ref 20–29)
Calcium: 9.3 mg/dL (ref 8.7–10.2)
Chloride: 103 mmol/L (ref 96–106)
Creatinine, Ser: 0.93 mg/dL (ref 0.76–1.27)
Globulin, Total: 3.1 g/dL (ref 1.5–4.5)
Glucose: 105 mg/dL — ABNORMAL HIGH (ref 70–99)
Potassium: 3.9 mmol/L (ref 3.5–5.2)
Sodium: 141 mmol/L (ref 134–144)
Total Protein: 7.3 g/dL (ref 6.0–8.5)
eGFR: 106 mL/min/{1.73_m2} (ref >59)

## 2023-11-23 LAB — IRON,TIBC AND FERRITIN PANEL
Ferritin: 50 ng/mL (ref 30–400)
Iron Saturation: 23 % (ref 15–55)
Iron: 81 ug/dL (ref 38–169)
Total Iron Binding Capacity: 359 ug/dL (ref 250–450)
UIBC: 278 ug/dL (ref 111–343)

## 2023-11-23 LAB — VITAMIN D 25 HYDROXY (VIT D DEFICIENCY, FRACTURES): Vit D, 25-Hydroxy: 32.9 ng/mL (ref 30.0–100.0)

## 2023-11-23 LAB — TSH: TSH: 2.81 u[IU]/mL (ref 0.450–4.500)

## 2023-11-24 ENCOUNTER — Encounter: Payer: Self-pay | Admitting: Family Medicine

## 2023-11-29 IMAGING — DX DG TOE GREAT 2+V*L*
3 series · 3 of 3 positions shown · non-contrast
Comparison: None.

CLINICAL DATA: Left great toe pain.

EXAM:
LEFT GREAT TOE

[toe ap]
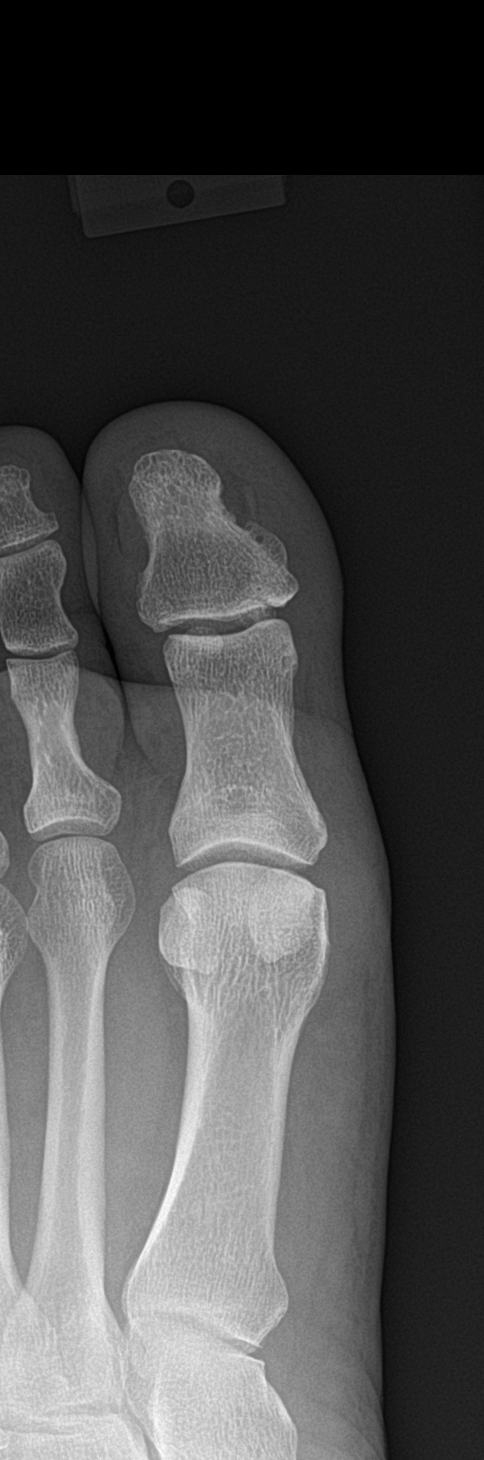

[toe obl]
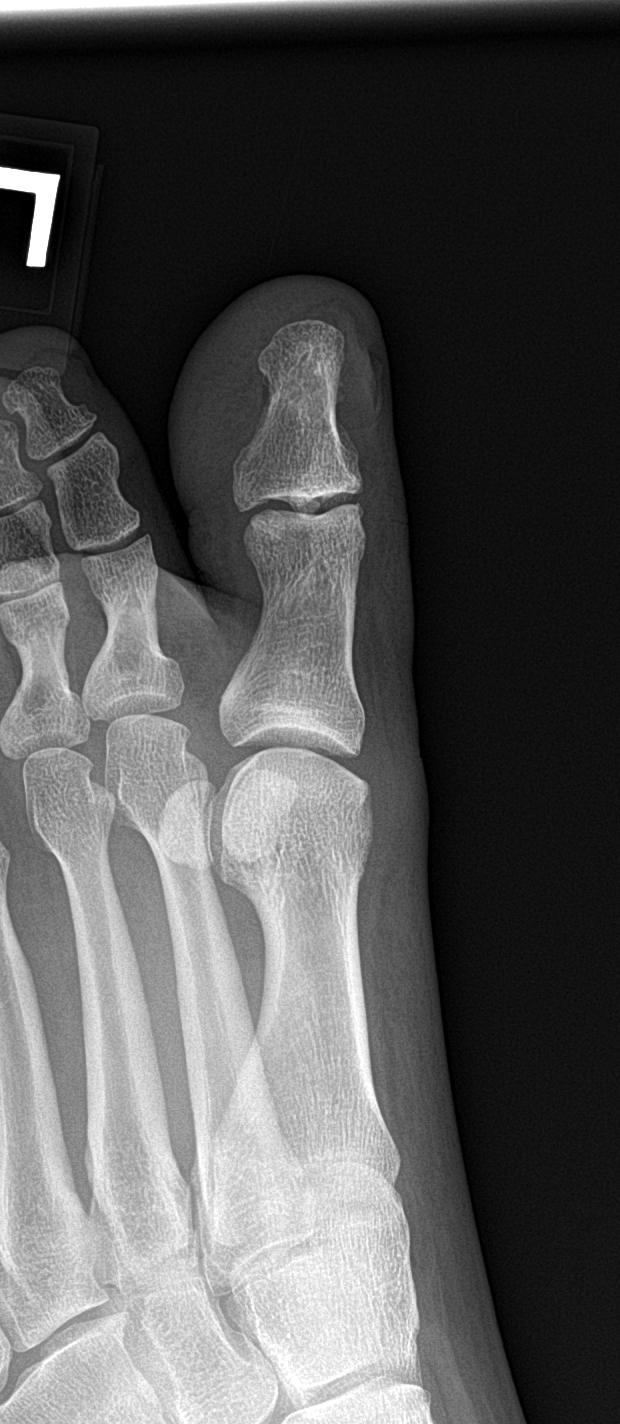

[toe lat]
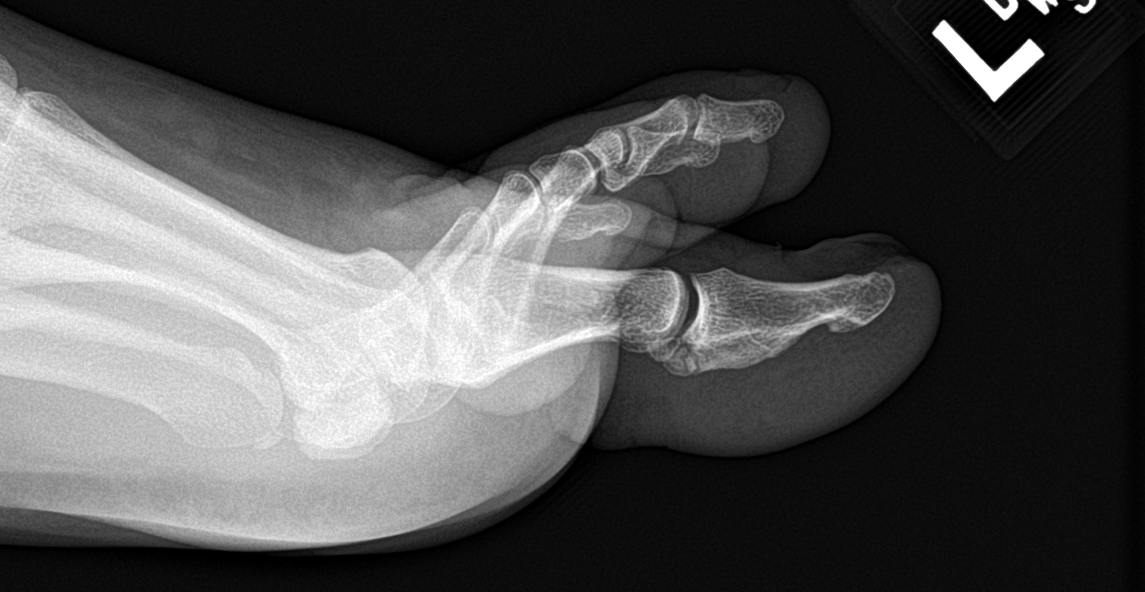

[3 of 3 positions shown; findings below may reference images not displayed]

FINDINGS: Nondisplaced fracture at the plantar base of the first distal
phalanx. No other fracture or dislocation. No aggressive osseous
lesion. Soft tissues are unremarkable.
IMPRESSION: 1. Nondisplaced fracture at the plantar base of the first distal
phalanx.

## 2023-12-06 ENCOUNTER — Encounter: Payer: Self-pay | Admitting: Family Medicine

## 2023-12-06 ENCOUNTER — Telehealth: Payer: Self-pay

## 2023-12-06 ENCOUNTER — Encounter: Payer: Self-pay | Admitting: Dermatology

## 2023-12-06 MED ORDER — FLUCONAZOLE 150 MG PO TABS: ORAL_TABLET | ORAL | 2 refills | Status: DC

## 2023-12-06 NOTE — Telephone Encounter (Signed)
Left pt msg to call for nail culture results.  Need to advise the following per Dr. Gwen Pounds:  "This pts Molecular path report from 10/31/2023 came back showing 3 types of fungus in toenail specimen. I would recommend oral antifungal treatment for best results. I would recommend Fluconazole (Diflucan) 150 mg three times a week for several months.  Pts liver tests and kidney tests from 10/2023 all normal.   discuss that should take [place], Wed Friday . disp 12 with 2 rf.  recheck in office for 3 mos follow up appt if does not already have a follow up. Side effects could be rash or other rare side effects. Please contact pt to review. "Marguerite Olea

## 2023-12-06 NOTE — Telephone Encounter (Signed)
I discussed with patient   This pts Molecular path report from 10/31/2023 came back showing 3 types of fungus in toenail specimen. I would recommend oral antifungal treatment for best results. I would recommend Fluconazole (Diflucan) 150 mg three times a week for several months.  Pts liver tests and kidney tests from 10/2023 all normal.   discuss that should take [place], Wed Friday . disp 12 with 2 rf.   Diflucan sent to [address]

## 2024-02-03 ENCOUNTER — Ambulatory Visit: Payer: Self-pay

## 2024-02-03 ENCOUNTER — Ambulatory Visit
Admission: EM | Admit: 2024-02-03 | Discharge: 2024-02-03 | Disposition: A | Discharge status: Home / Self Care | Attending: Emergency Medicine | Admitting: Emergency Medicine

## 2024-02-03 DIAGNOSIS — S0181XA Laceration without foreign body of other part of head, initial encounter: Secondary | ICD-10-CM

## 2024-02-03 DIAGNOSIS — S0083XA Contusion of other part of head, initial encounter: Secondary | ICD-10-CM

## 2024-02-03 NOTE — Telephone Encounter (Signed)
 Chief Complaint: Head injury  Symptoms: Swollen above left eyebrow, open area not currently bleeding  Frequency: Onset about 5 minutes ago  Pertinent Negatives: Patient denies headache, dizziness, vomiting Disposition: [] ED /[x] Urgent Care (no appt availability in office) / [] Appointment(In office/virtual)/ []  [place] [facility]/ [] Home Care/ [] Refused Recommended Disposition /[] [place] [facility]/ []  Follow-up with PCP Additional Notes: Patient states he accidentally hit hisself in the head above the left eyebrow with a yard tool. Patient states the area is swollen and cut open but denies bleeding. Patient reports mild discomfort. Care advice given and urgent care ws recommended for evaluation. Patient states he will seek care at [facility] [place] today.    Copied from CRM [id number]. Topic: Clinical - Red Word Triage >> Feb 03, 2024  4:14 PM Albin Felling L wrote: Red Word that prompted transfer to Nurse Triage: Bump that split above left eye Reason for Disposition  [1] SEVERE pain AND [2] not improved 2 hours after pain medicine/ice packs  Answer Assessment - Initial Assessment Questions 1. MECHANISM: "How did the injury happen?"      I hit myself with a yard tool on accident  2. ONSET: "When did the injury happen?" (Minutes or hours ago)     5 minutes ago 3. LOCATION: "What part of the face is injured?"     Right above the left eye  4. APPEARANCE of INJURY: "What does the face look like?"     Swollen and red  5. BLEEDING: "Is it bleeding now?" If Yes, ask: "Is it difficult to stop?"     No  6. PAIN: "Is there pain?" If Yes, ask: "How bad is the pain?"  (e.g., Scale 1-10; or mild, moderate, severe)     Mild  7. SIZE: For cuts, bruises, or swelling, ask: "How large is it?" (e.g., inches or centimeters)      Between a quarter and 50 cent piece  8. TETANUS: For any breaks in the skin, ask: "When was the last tetanus booster?"     Unsure  9. OTHER SYMPTOMS: "Do you have  any other symptoms?" (e.g., neck pain, headache, loss of consciousness)     No  Protocols used: Face Injury-A-AH

## 2024-02-03 NOTE — ED Provider Notes (Addendum)
 Lance Bennett    CSN: [id number] Arrival date & time: 02/03/24  1648      History   Chief Complaint No chief complaint on file.   HPI Lance Bennett is a 43 y.o. male.   Patient presents for evaluation of hand injury occurring within the last 40 minutes.  Was working outside when a metal rod hit the left side of the forehead causing a cut and a bump.  Site is not bleeding.  Has been applying ice continuously.  Denies headache, dizziness, lightheadedness, syncope, visual disturbance, nausea, vomiting.  Blood pressure 150/98, pulse rate 65, 98% on room air, 98.9 temperature, respirations 17   Past Medical History:  Diagnosis Date   No pertinent past medical history    Varicocele     Patient Active Problem List   Diagnosis Date Noted   Psoriasis 11/22/2023   Bilateral headaches 06/21/2023   Rebound headache 06/21/2023   Onychomycosis 11/04/2022   Celiac disease 08/27/2021   Vitreous floaters of both eyes 08/27/2020   Obesity 08/26/2020    Past Surgical History:  Procedure Laterality Date   ESOPHAGOGASTRODUODENOSCOPY (EGD) WITH PROPOFOL N/A 05/29/2021   Procedure: ESOPHAGOGASTRODUODENOSCOPY (EGD) WITH PROPOFOL;  Surgeon: Wyline Mood, MD;  Location: [facility] ENDOSCOPY;  Service: Gastroenterology;  Laterality: N/A;   TONSILLECTOMY AND ADENOIDECTOMY  1996       Home Medications    Prior to Admission medications   Medication Sig Start Date End Date Taking? Authorizing Provider  colchicine 0.6 MG tablet Take 1 tablet (0.6 mg total) by mouth daily. 12/31/21   Candelaria Stagers, DPM  fluconazole (DIFLUCAN) 150 MG tablet Take 1 tablet Monday, Wednesday, Friday 12/06/23   Deirdre Evener, MD  ketoconazole (NIZORAL) 2 % shampoo Apply 1 Application topically as directed. wash trunk 3 times a week for 2 months, then once a month for maintenance, let sit 5 minutes and rinse off, for Tinea versicolor 10/31/23   Deirdre Evener, MD  triamcinolone cream (KENALOG) 0.1 % Apply 1  Application topically as directed. Qd up to 5 days per week affected area psoriasis buttocks, elbows, feet, toenails until clear, then prn flares, avoid face, groin, axilla 10/31/23   Deirdre Evener, MD    Family History Family History  Problem Relation Age of Onset   Diabetes Mother        diet-controlled   Healthy Father    Healthy Sister    Heart defect Brother    Diabetes Maternal Grandmother    Breast cancer Maternal Grandmother    Stroke Maternal Grandfather    Diabetes Maternal Grandfather    Transient ischemic attack Maternal Grandfather    Prostate cancer Neg Hx    Colon cancer Neg Hx     Social History Social History   Tobacco Use   Smoking status: Never   Smokeless tobacco: Never  Vaping Use   Vaping status: Never Used  Substance Use Topics   Alcohol use: Yes    Comment: Rarely, social   Drug use: Never     Allergies   Patient has no known allergies.   Review of Systems Review of Systems   Physical Exam Triage Vital Signs ED Triage Vitals  Encounter Vitals Group     BP      Systolic BP Percentile      Diastolic BP Percentile      Pulse      Resp      Temp      Temp src  SpO2      Weight      Height      Head Circumference      Peak Flow      Pain Score      Pain Loc      Pain Education      Exclude from Growth Chart    No data found.  Updated Vital Signs There were no vitals taken for this visit.  Visual Acuity Right Eye Distance:   Left Eye Distance:   Bilateral Distance:    Right Eye Near:   Left Eye Near:    Bilateral Near:     Physical Exam Constitutional:      Appearance: Normal appearance.  HENT:     Head:     Comments: 1 x 2 contusion present to the left forehead above the eyebrow with 1 cm superficial laceration present Eyes:     Extraocular Movements: Extraocular movements intact.     Conjunctiva/sclera: Conjunctivae normal.     Pupils: Pupils are equal, round, and reactive to light.  Pulmonary:      Effort: Pulmonary effort is normal.  Neurological:     General: No focal deficit present.     Mental Status: He is alert and oriented to person, place, and time. Mental status is at baseline.     Cranial Nerves: No cranial nerve deficit.     Sensory: No sensory deficit.     Motor: No weakness.     Coordination: Coordination normal.     Gait: Gait normal.      UC Treatments / Results  Labs (all labs ordered are listed, but only abnormal results are displayed) Labs Reviewed - No data to display  EKG   Radiology No results found.  Procedures Laceration Repair  Date/Time: 02/03/2024 5:33 PM  Performed by: Valinda Hoar, NP Authorized by: Valinda Hoar, NP   Consent:    Consent obtained:  Verbal   Consent given by:  Patient   Risks, benefits, and alternatives were discussed: yes     Risks discussed:  Infection Universal protocol:    Procedure explained and questions answered to patient or proxy's satisfaction: yes     Patient identity confirmed:  Verbally with patient Anesthesia:    Anesthesia method:  None Laceration details:    Location:  Face   Face location:  Forehead   Length (cm):  1 Pre-procedure details:    Preparation:  Patient was prepped and draped in usual sterile fashion Exploration:    Wound exploration: entire depth of wound visualized   Treatment:    Area cleansed with:  Chlorhexidine   Amount of cleaning:  Standard   Irrigation method:  Tap Skin repair:    Repair method:  Steri-Strips   Number of Steri-Strips:  1 Approximation:    Approximation:  Close Repair type:    Repair type:  Simple Post-procedure details:    Dressing:  Open (no dressing)   Procedure completion:  Tolerated  (including critical care time)  Medications Ordered in UC Medications - No data to display  Initial Impression / Assessment and Plan / UC Course  I have reviewed the triage vital signs and the nursing notes.  Pertinent labs & imaging results that were  available during my care of the patient were reviewed by me and considered in my medical decision making (see chart for details).  Contusion of forehead, laceration of forehead  Vitals are stable, no neurological deficits on exam, low suspicion for concussion,  advised to monitor and given warning signs for emergency department evaluation, cut a superficial, cleanse with chlorhexidine and Steri-Strip applied, advised daily cleansing and monitor and to return for any signs of infection, last tetanus 2022, will not need to update, discussed. Final Clinical Impressions(s) / UC Diagnoses   Final diagnoses:  Contusion of forehead, initial encounter  Laceration of forehead, initial encounter     Discharge Instructions      Today you are evaluated for your head injury  At this time no neurological changes on exam that would indicate serious injury to your brain however please monitor over the next 24 hours  Watch for signs such as a severe headache, dizziness, lightheadedness, passing out, vomiting, drowsiness and difficulty to wake up from sleep, changes to behavior, difficulty focusing, processing information or speech, if this occurs at any point please seek out reevaluation  Wound has been cleansed here in the clinic and a Steri-Strip applied  Steri-Strips naturally fall off with time, may trim the edges but do not pull or peel off  Do not allow area to become soaking wet but may become damp with water  Cleanse daily with unscented soap and water, pat and do not rub  May continue activity as tolerated  On the forehead there is a contusion (bruise underneath the skin), this will heal with time, may use ice over the affected area 10 to 15-minute intervals for the next 24 hours then as needed  Last tetanus was in 2022, up-to-date   ED Prescriptions   None    PDMP not reviewed this encounter.   Valinda Hoar, NP 02/03/24 1735    Valinda Hoar, NP 02/03/24 1736

## 2024-02-03 NOTE — Discharge Instructions (Addendum)
 Today you are evaluated for your head injury  At this time no neurological changes on exam that would indicate serious injury to your brain however please monitor over the next 24 hours  Watch for signs such as a severe headache, dizziness, lightheadedness, passing out, vomiting, drowsiness and difficulty to wake up from sleep, changes to behavior, difficulty focusing, processing information or speech, if this occurs at any point please seek out reevaluation  Wound has been cleansed here in the clinic and a Steri-Strip applied  Steri-Strips naturally fall off with time, may trim the edges but do not pull or peel off  Do not allow area to become soaking wet but may become damp with water  Cleanse daily with unscented soap and water, pat and do not rub  May continue activity as tolerated  On the forehead there is a contusion (bruise underneath the skin), this will heal with time, may use ice over the affected area 10 to 15-minute intervals for the next 24 hours then as needed  Last tetanus was in 2022, up-to-date

## 2024-02-03 NOTE — ED Notes (Signed)
 Provider evaluation and discharge occurred prior to this RN seeing patient

## 2024-03-01 ENCOUNTER — Ambulatory Visit: Payer: Managed Care, Other (non HMO) | Admitting: Dermatology

## 2024-03-01 ENCOUNTER — Encounter: Payer: Self-pay | Admitting: Dermatology

## 2024-03-01 DIAGNOSIS — B360 Pityriasis versicolor: Secondary | ICD-10-CM | POA: Diagnosis not present

## 2024-03-01 DIAGNOSIS — B351 Tinea unguium: Secondary | ICD-10-CM | POA: Diagnosis not present

## 2024-03-01 DIAGNOSIS — L409 Psoriasis, unspecified: Secondary | ICD-10-CM

## 2024-03-01 DIAGNOSIS — Z7189 Other specified counseling: Secondary | ICD-10-CM | POA: Diagnosis not present

## 2024-03-01 DIAGNOSIS — Z79899 Other long term (current) drug therapy: Secondary | ICD-10-CM

## 2024-03-01 MED ORDER — FLUCONAZOLE 150 MG PO TABS: ORAL_TABLET | ORAL | 0 refills | Status: DC

## 2024-03-01 MED ORDER — KETOCONAZOLE 2 % EX CREA: TOPICAL_CREAM | CUTANEOUS | 11 refills | Status: AC

## 2024-03-01 NOTE — Progress Notes (Signed)
 Follow-Up Visit   Subjective  Lance Bennett is a 43 y.o. male who presents for the following: 3 month follow up on Tinea versicolor at back, using ketoconazole  shampoo - reports improved. Psoriasis at elbows, feet, toenails and buttocks. Reports areas having improved while using tmc cream to affected areas. Molecular study showed nail fungus in toenails. Reports improved fluconazole  150 mg 3 days a week for 2 months. Denies side effects. Also using otc athletes foot  For molecular study that showed positive results for toenail fungus.  After a month of taking, started using athletes foot cream otc cream daily.   The patient has spots, moles and lesions to be evaluated, some may be new or changing and the patient may have concern these could be cancer.  The following portions of the chart were reviewed this encounter and updated as appropriate: medications, allergies, medical history  Review of Systems:  No other skin or systemic complaints except as noted in HPI or Assessment and Plan.  Objective  Well appearing patient in no apparent distress; mood and affect are within normal limits.  A focused examination was performed of the following areas: Back, toenails, elbows, feet, buttocks,   Relevant exam findings are noted in the Assessment and Plan.   Assessment & Plan   PSORIASIS  Feet, toenails, elbows, buttocks Exam: clear today  0 BSA. Chronic condition with duration or expected duration over one year. Currently well-controlled. patient denies joint pain Psoriasis is a chronic non-curable, but treatable genetic/hereditary disease that may have other systemic features affecting other organ systems such as joints (Psoriatic Arthritis). It is associated with an increased risk of inflammatory bowel disease, heart disease, non-alcoholic fatty liver disease, and depression.  Treatments include light and laser treatments; topical medications; and systemic medications including oral and  injectables.   Treatment Plan: Can continue use as needed for flares TMC 0.1% cr hs 5d/wk to elbows, feet and toenails prn flares, avoid f/g/a  Will call if needs more refills   Topical steroids (such as triamcinolone , fluocinolone, fluocinonide, mometasone, clobetasol, halobetasol, betamethasone, hydrocortisone) can cause thinning and lightening of the skin if they are used for too long in the same area. Your physician has selected the right strength medicine for your problem and area affected on the body. Please use your medication only as directed by your physician to prevent side effects.   ONYCHOMYCOSIS Exam: Thickened toenails with subungal debris c/w onychomycosis Chronic and persistent condition with duration or expected duration over one year. Condition is symptomatic/ bothersome to patient. Not currently at goal.  Treatment Plan: Continue for another month if desired-  fluconazole  150 mg capsule by mouth 3 times a week  Side effects of fluconazole  (diflucan ) include nausea, diarrhea, headache, dizziness, taste changes, rare risk of irritation of the liver, allergy, or decreased blood counts (which could show up as infection or tiredness).  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 Currently well-controlled. Use ketoconazole  shampoo as body wash 2-3 times a week for 2 months, then once a month for maintenance, let sit 5 minutes and rinse out   Tinea versicolor is a chronic recurrent skin rash causing discolored scaly spots most commonly seen on back, chest, and/or shoulders.  It is generally asymptomatic. The rash is due to overgrowth of a common type of yeast present on everyone's skin and it is not contagious.  It tends to  flare more in the summer due to increased sweating on trunk.  After rash is treated, the scaliness will resolve, but the discoloration will take longer to  return to normal pigmentation. The periodic use of an OTC medicated soap/shampoo with zinc  or selenium sulfide can be helpful to prevent yeast overgrowth and recurrence.   Long term medication management.  Patient is using long term (months to years) prescription medication  to control their dermatologic condition.  These medications require periodic monitoring to evaluate for efficacy and side effects and may require periodic laboratory monitoring.   TINEA UNGUIUM   Related Medications fluconazole  (DIFLUCAN ) 150 MG tablet Take 1 tablet Monday, Wednesday, Friday ketoconazole  (NIZORAL ) 2 % cream Apply topically to feet and toes at bedtime daily  Return for 6 - 12 psoriasis follow up.  IRandee Busing, CMA, am acting as scribe for Celine Collard, MD.   Documentation: I have reviewed the above documentation for accuracy and completeness, and I agree with the above.  Celine Collard, MD

## 2024-03-01 NOTE — Patient Instructions (Addendum)
Side effects of fluconazole (diflucan) include nausea, diarrhea, headache, dizziness, taste changes, rare risk of irritation of the liver, allergy, or decreased blood counts (which could show up as infection or tiredness).   Due to recent changes in healthcare laws, you may see results of your pathology and/or laboratory studies on MyChart before the doctors have had a chance to review them. We understand that in some cases there may be results that are confusing or concerning to you. Please understand that not all results are received at the same time and often the doctors may need to interpret multiple results in order to provide you with the best plan of care or course of treatment. Therefore, we ask that you please give [place] 2 business days to thoroughly review all your results before contacting the office for clarification. Should we see a critical lab result, you will be contacted sooner.   If You Need Anything After Your Visit  If you have any questions or concerns for your doctor, please call our main line at [phone] and press option 4 to reach your doctor's medical assistant. If no one answers, please leave a voicemail as directed and we will return your call as soon as possible. Messages left after 4 pm will be answered the following business day.   You may also send [place] a message via MyChart. We typically respond to MyChart messages within 1-2 business days.  For prescription refills, please ask your pharmacy to contact our office. Our fax number is [phone].  If you have an urgent issue when the clinic is closed that cannot wait until the next business day, you can page your doctor at the number below.    Please note that while we do our best to be available for urgent issues outside of office hours, we are not available 24/7.   If you have an urgent issue and are unable to reach [place], you may choose to seek medical care at your doctor's office, retail clinic, urgent care center, or  emergency room.  If you have a medical emergency, please immediately call 911 or go to the emergency department.  Pager Numbers  - Dr. Gwen Pounds: [phone]  - Dr. Roseanne Reno: [phone]  - Dr. Katrinka Blazing: [phone]   In the event of inclement weather, please call our main line at [phone] for an update on the status of any delays or closures.  Dermatology Medication Tips: Please keep the boxes that topical medications come in in order to help keep track of the instructions about where and how to use these. Pharmacies typically print the medication instructions only on the boxes and not directly on the medication tubes.   If your medication is too expensive, please contact our office at [phone] option 4 or send [place] a message through MyChart.   We are unable to tell what your co-pay for medications will be in advance as this is different depending on your insurance coverage. However, we may be able to find a substitute medication at lower cost or fill out paperwork to get insurance to cover a needed medication.   If a prior authorization is required to get your medication covered by your insurance company, please allow [place] 1-2 business days to complete this process.  Drug prices often vary depending on where the prescription is filled and some pharmacies may offer cheaper prices.  The website www.goodrx.com contains coupons for medications through different pharmacies. The prices here do not account for what the cost may be with help from  insurance (it may be cheaper with your insurance), but the website can give you the price if you did not use any insurance.  - You can print the associated coupon and take it with your prescription to the pharmacy.  - You may also stop by our office during regular business hours and pick up a GoodRx coupon card.  - If you need your prescription sent electronically to a different pharmacy, notify our office through [facility] or by phone at  [phone] option 4.     Si Usted Necesita Algo Despus de Su Visita  Tambin puede enviarnos un mensaje a travs de Clinical cytogeneticist. Por lo general respondemos a los mensajes de MyChart en el transcurso de 1 a 2 das hbiles.  Para renovar recetas, por favor pida a su farmacia que se ponga en contacto con nuestra oficina. Annie Sable de fax es [place] [phone].  Si tiene un asunto urgente cuando la clnica est cerrada y que no puede esperar hasta el siguiente da hbil, puede llamar/localizar a su doctor(a) al nmero que aparece a continuacin.   Por favor, tenga en cuenta que aunque hacemos todo lo posible para estar disponibles para asuntos urgentes fuera del horario de [place], no estamos disponibles las 24 horas del da, los 7 [address]  [address] de la [place].   Si tiene un problema urgente y no puede comunicarse con nosotros, puede optar por buscar atencin mdica  en el consultorio de su doctor(a), en una clnica privada, en un centro de atencin urgente o en una sala de emergencias.  Si tiene Engineer, drilling, por favor llame inmediatamente al 911 o vaya a la sala de emergencias.  Nmeros de bper  - Dr. Gwen Pounds: [phone]  - Dra. Roseanne Reno: [phone]  - Dr. Katrinka Blazing: [phone]   En caso de inclemencias del tiempo, por favor llame a Lacy Duverney principal al [phone] para una actualizacin sobre el [place] de cualquier retraso o cierre.  Consejos para la medicacin en dermatologa: Por favor, guarde las cajas en las que vienen los medicamentos de uso tpico para ayudarle a seguir las instrucciones sobre dnde y cmo usarlos. Las farmacias generalmente imprimen las instrucciones del medicamento slo en las cajas y no directamente en los tubos del [place].   Si su medicamento es muy caro, por favor, pngase en contacto con Rolm Gala llamando al [phone] y presione la opcin 4 o envenos un mensaje a travs de Clinical cytogeneticist.   No podemos decirle cul ser su copago por los  medicamentos por adelantado ya que esto es diferente dependiendo de la cobertura de su seguro. Sin embargo, es posible que podamos encontrar un medicamento sustituto a Audiological scientist un formulario para que el seguro cubra el medicamento que se considera necesario.   Si se requiere una autorizacin previa para que su compaa de seguros [place] su medicamento, por favor permtanos de 1 a 2 das hbiles para completar [address]  Los precios de los medicamentos varan con frecuencia dependiendo del Environmental consultant de dnde se surte la receta y alguna farmacias pueden ofrecer precios ms baratos.  El sitio web www.goodrx.com tiene cupones para medicamentos de Health and safety inspector. Los precios aqu no tienen en cuenta lo que podra costar con la ayuda del seguro (puede ser ms barato con su seguro), pero el sitio web puede darle el precio si no utiliz Tourist information centre manager.  - Puede imprimir el cupn correspondiente y llevarlo con su receta a la farmacia.  - Tambin puede pasar por nuestra oficina durante el horario  de atencin regular y Education officer, museum una tarjeta de cupones de GoodRx.  - Si necesita que su receta se enve electrnicamente a una farmacia diferente, informe a nuestra oficina a travs de MyChart de [place] o por telfono llamando al [phone] y presione la opcin 4.

## 2024-03-13 ENCOUNTER — Encounter: Payer: Self-pay | Admitting: Family Medicine

## 2024-03-14 ENCOUNTER — Ambulatory Visit (INDEPENDENT_AMBULATORY_CARE_PROVIDER_SITE_OTHER): Admitting: Physician Assistant

## 2024-03-14 ENCOUNTER — Encounter: Payer: Self-pay | Admitting: Physician Assistant

## 2024-03-14 VITALS — BP 113/62 | HR 59 | Resp 16 | Ht 66.0 in | Wt 215.0 lb

## 2024-03-14 DIAGNOSIS — L409 Psoriasis, unspecified: Secondary | ICD-10-CM

## 2024-03-14 DIAGNOSIS — L989 Disorder of the skin and subcutaneous tissue, unspecified: Secondary | ICD-10-CM

## 2024-03-14 DIAGNOSIS — T889XXA Complication of surgical and medical care, unspecified, initial encounter: Secondary | ICD-10-CM

## 2024-03-14 DIAGNOSIS — K900 Celiac disease: Secondary | ICD-10-CM | POA: Diagnosis not present

## 2024-03-14 DIAGNOSIS — B351 Tinea unguium: Secondary | ICD-10-CM

## 2024-03-14 DIAGNOSIS — R21 Rash and other nonspecific skin eruption: Secondary | ICD-10-CM

## 2024-03-14 NOTE — Progress Notes (Unsigned)
 Established patient visit  Patient: Lance Bennett   DOB: 1981/04/07   43 y.o. Male  MRN: [id number] Visit Date: 03/14/2024  Today's healthcare provider: Blane Bunting, PA-C   Chief Complaint  Patient presents with   Pain    Lt side red spots on neck pain since yesterday   Subjective       Discussed the use of AI scribe software for clinical note transcription with the patient, who gave verbal consent to proceed.  History of Present Illness Lance Bennett "Verdie Gladden" is a 43 year old male with celiac disease and psoriasis who presents with a swollen and tender neck bump.  Two days ago, he developed a bump on his neck below the jaw hinge, initially resembling a pimple, with accompanying neck swelling and stiffness. Symptoms peaked in severity yesterday and have slightly improved today, but the area remains sore and tender.  This is the third occurrence of such a bump, previously associated with flu-like symptoms and sore throat, though this time he did not experience any flu-like symptoms. He reports no recent insect bites or stings.  No fatigue, fever, night sweats, cough, cold intolerance, heat intolerance, hoarseness, congestion, or post-nasal drainage. He feels hotter than usual, attributing it to the weather, and reports occasional sneezing.  He adheres to a gluten-free diet for celiac disease and is on fluconazole  for a fungal infection, taking three pills a week for a total of three months and one additional month. He uses topical antifungal treatments and nizoral  cream for skin conditions. He has a prescription for colchicine  for gout, used only during acute episodes, and denies current use.       03/14/2024    1:52 PM 11/22/2023    9:21 AM 06/21/2023    9:48 AM  Depression screen PHQ 2/9  Decreased Interest 0 0 0  Down, Depressed, Hopeless 0 0 0  PHQ - 2 Score 0 0 0  Altered sleeping 0  0  Tired, decreased energy 0  0  Change in appetite 0  0  Feeling bad or failure  about yourself  0  0  Trouble concentrating 0  0  Moving slowly or fidgety/restless 0  0  Suicidal thoughts 0  0  PHQ-9 Score 0  0  Difficult doing work/chores Not difficult at all  Not difficult at all      03/14/2024    1:52 PM 11/22/2023    9:22 AM 06/21/2023    9:48 AM  GAD 7 : Generalized Anxiety Score  Nervous, Anxious, on Edge 0 0 0  Control/stop worrying 0 0 0  Worry too much - different things 0 0 0  Trouble relaxing 0 0 0  Restless 0 0 0  Easily annoyed or irritable 0 0 0  Afraid - awful might happen 0 0 0  Total GAD 7 Score 0 0 0  Anxiety Difficulty Not difficult at all Not difficult at all     Medications: Outpatient Medications Prior to Visit  Medication Sig   fluconazole  (DIFLUCAN ) 150 MG tablet Take 1 tablet Monday, Wednesday, Friday   triamcinolone  cream (KENALOG ) 0.1 % Apply 1 Application topically as directed. Qd up to 5 days per week affected area psoriasis buttocks, elbows, feet, toenails until clear, then prn flares, avoid face, groin, axilla   colchicine  0.6 MG tablet Take 1 tablet (0.6 mg total) by mouth daily. (Patient not taking: Reported on 03/14/2024)   ketoconazole  (NIZORAL ) 2 % cream Apply topically to feet and toes at bedtime  daily (Patient not taking: Reported on 03/14/2024)   ketoconazole  (NIZORAL ) 2 % shampoo Apply 1 Application topically as directed. wash trunk 3 times a week for 2 months, then once a month for maintenance, let sit 5 minutes and rinse off, for Tinea versicolor (Patient not taking: Reported on 03/14/2024)   No facility-administered medications prior to visit.    Review of Systems All negative Except see HPI   {Insert previous labs (optional):23779} {See past labs  Heme  Chem  Endocrine  Serology  Results Review (optional):1}   Objective    BP 113/62 (BP Location: Left Arm, Patient Position: Sitting, Cuff Size: Large)   Pulse (!) 59   Resp 16   Ht 5\' 6"  (1.676 m)   Wt 215 lb (97.5 kg)   SpO2 98%   BMI 34.70 kg/m   {Insert last BP/Wt (optional):23777}{See vitals history (optional):1}   Physical Exam   No results found for any visits on 03/14/24.      Assessment & Plan Onychomycosis Chronic, Improving Significant improvement with fluconazole  and topical antifungal. Discussed monitoring liver function and creatinine levels. - Order blood work for liver function and creatinine levels. Will follow-up  Rhinitis  Psoriasis Chronic Stable without acute exacerbation. Fluconazole  may benefit condition.  Celiac disease Improved alkaline phosphatase levels with gluten-free diet. No active symptoms.    No orders of the defined types were placed in this encounter.   No follow-ups on file.   The patient was advised to call back or seek an in-person evaluation if the symptoms worsen or if the condition fails to improve as anticipated.  I discussed the assessment and treatment plan with the patient. The patient was provided an opportunity to ask questions and all were answered. The patient agreed with the plan and demonstrated an understanding of the instructions.  I, Sonita Michiels, PA-C have reviewed all documentation for this visit. The documentation on 03/14/2024  for the exam, diagnosis, procedures, and orders are all accurate and complete.  Blane Bunting, [facility], MMS [facility] [phone] (phone) [phone] (fax)  [facility]

## 2024-03-15 ENCOUNTER — Ambulatory Visit: Payer: Self-pay | Admitting: Physician Assistant

## 2024-03-15 LAB — COMPREHENSIVE METABOLIC PANEL WITH GFR
ALT: 26 IU/L (ref 0–44)
AST: 21 IU/L (ref 0–40)
Albumin: 4.5 g/dL (ref 4.1–5.1)
Alkaline Phosphatase: 135 IU/L — ABNORMAL HIGH (ref 44–121)
BUN/Creatinine Ratio: 13 (ref 9–20)
BUN: 14 mg/dL (ref 6–24)
Bilirubin Total: 0.3 mg/dL (ref 0.0–1.2)
CO2: 21 mmol/L (ref 20–29)
Calcium: 9.6 mg/dL (ref 8.7–10.2)
Chloride: 101 mmol/L (ref 96–106)
Creatinine, Ser: 1.07 mg/dL (ref 0.76–1.27)
Globulin, Total: 2.9 g/dL (ref 1.5–4.5)
Glucose: 94 mg/dL (ref 70–99)
Potassium: 4.1 mmol/L (ref 3.5–5.2)
Sodium: 140 mmol/L (ref 134–144)
Total Protein: 7.4 g/dL (ref 6.0–8.5)
eGFR: 89 mL/min/{1.73_m2} (ref >59)

## 2024-03-16 ENCOUNTER — Encounter: Payer: Self-pay | Admitting: Family Medicine

## 2024-03-16 DIAGNOSIS — M109 Gout, unspecified: Secondary | ICD-10-CM

## 2024-03-16 MED ORDER — COLCHICINE 0.6 MG PO TABS
0.6000 mg | ORAL_TABLET | Freq: Every day | ORAL | 2 refills | Status: AC | PRN
Start: 2024-03-16 — End: ?

## 2024-03-16 NOTE — Telephone Encounter (Signed)
 Ok to refill colchicine  0.6mg  - take 1 tab daily prn for gout flare. #30 r2

## 2024-03-20 DIAGNOSIS — L989 Disorder of the skin and subcutaneous tissue, unspecified: Secondary | ICD-10-CM | POA: Insufficient documentation

## 2024-03-20 DIAGNOSIS — R21 Rash and other nonspecific skin eruption: Secondary | ICD-10-CM | POA: Insufficient documentation

## 2024-04-19 ENCOUNTER — Encounter: Payer: Self-pay | Admitting: Family Medicine

## 2024-04-22 ENCOUNTER — Encounter: Payer: Self-pay | Admitting: Dermatology

## 2024-04-24 ENCOUNTER — Encounter: Payer: Self-pay | Admitting: Dermatology

## 2024-04-24 ENCOUNTER — Ambulatory Visit: Admitting: Dermatology

## 2024-04-24 DIAGNOSIS — L820 Inflamed seborrheic keratosis: Secondary | ICD-10-CM | POA: Diagnosis not present

## 2024-04-24 DIAGNOSIS — Z7189 Other specified counseling: Secondary | ICD-10-CM

## 2024-04-24 NOTE — Patient Instructions (Addendum)

## 2024-04-24 NOTE — Progress Notes (Signed)
   Follow-Up Visit   Subjective  Lance Bennett is a 43 y.o. male who presents for the following: The patient has spots on his hands to be evaluated, some may be new or changing and the patient may have concern these could be cancer.  The following portions of the chart were reviewed this encounter and updated as appropriate: medications, allergies, medical history  Review of Systems:  No other skin or systemic complaints except as noted in HPI or Assessment and Plan.  Objective  Well appearing patient in no apparent distress; mood and affect are within normal limits.  A focused examination was performed of the following areas: Face,hands   Relevant exam findings are noted in the Assessment and Plan.  left dorsal hand x 5, right hand x 5 (10) Stuck-on, waxy, tan-brown papules and plaques -- Discussed benign etiology and prognosis.   Assessment & Plan   INFLAMED SEBORRHEIC KERATOSIS (10) left dorsal hand x 5, right hand x 5 (10) ISK vs Warts   Discussed viral / HPV (Human Papilloma Virus) etiology and risk of spread /infectivity to other areas of body as well as to other people.  Multiple treatments and methods may be required to clear warts and it is possible treatment may not be successful.  Treatment risks include discoloration; scarring and there is still potential for wart recurrence.   Destruction of lesion - left dorsal hand x 5, right hand x 5 (10) Complexity: simple   Destruction method: cryotherapy   Informed consent: discussed and consent obtained   Timeout:  patient name, date of birth, surgical site, and procedure verified Lesion destroyed using liquid nitrogen: Yes   Region frozen until ice ball extended beyond lesion: Yes   Outcome: patient tolerated procedure well with no complications   Post-procedure details: wound care instructions given    Return if symptoms worsen or fail to improve.  IFay Kirks, CMA, am acting as scribe for Alm Rhyme, MD .    Documentation: I have reviewed the above documentation for accuracy and completeness, and I agree with the above.  Alm Rhyme, MD

## 2024-05-23 ENCOUNTER — Encounter: Payer: Self-pay | Admitting: Family Medicine

## 2024-05-23 ENCOUNTER — Ambulatory Visit (INDEPENDENT_AMBULATORY_CARE_PROVIDER_SITE_OTHER): Admitting: Family Medicine

## 2024-05-23 VITALS — BP 117/81 | HR 62 | Temp 98.2°F | Ht 66.0 in

## 2024-05-23 DIAGNOSIS — M7701 Medial epicondylitis, right elbow: Secondary | ICD-10-CM | POA: Diagnosis not present

## 2024-05-23 DIAGNOSIS — L600 Ingrowing nail: Secondary | ICD-10-CM | POA: Diagnosis not present

## 2024-05-23 MED ORDER — CEPHALEXIN 500 MG PO CAPS: 500.0000 mg | ORAL_CAPSULE | Freq: Three times a day (TID) | ORAL | 0 refills | Status: DC

## 2024-05-23 MED ORDER — NAPROXEN 500 MG PO TABS: 500.0000 mg | ORAL_TABLET | Freq: Two times a day (BID) | ORAL | 0 refills | Status: DC

## 2024-05-23 NOTE — Patient Instructions (Signed)
 Lance Bennett  Please review the attached list of medications and notify my office if there are any errors.   . Please bring all of your medications to every appointment so we can make sure that our medication list is the same as yours.

## 2024-05-23 NOTE — Progress Notes (Signed)
 Established patient visit   Patient: Lance Bennett   DOB: [birth year]   43 y.o. Male  MRN: [id number] Visit Date: 05/23/2024  Today's healthcare provider: Nancyann Perry, MD   Chief Complaint  Patient presents with   Ingrown Toenail    Located on both big toes, red, painful, using neosporin.    Elbow Pain    R elbow pain, when turning lower arm, causes some pain. Onset 6-8 weeks ago, patient says it was better for a little bit then started hurting again.   Subjective    Discussed the use of AI scribe software for clinical note transcription with the patient, who gave verbal consent to proceed.  History of Present Illness   Lance Bennett is a 43 year old male who presents with foot pain and elbow discomfort.  He experiences significant discomfort in his feet, primarily due to issues with his toenails. He was on antifungal medication for four months, which helped his toenails grow back after being nonexistent for 25 years due to a fungal infection. He has been using clotrimazole topically. Recently, he has experienced redness and pain around the toenails, particularly in two toes, which he suspects might be infected. He has been cutting the toenails at an angle in an attempt to relieve discomfort from the nails digging into the skin. The pain was initially noticed during a vacation when he was walking extensively, but it has persisted since returning home and wearing shoes more frequently.  He also reports elbow pain that has been present for about eight weeks, starting around May. The pain is localized to the inner part of the elbow and is exacerbated by certain movements, such as rotating his wrist. He initially tried to manage the pain by exercising the elbow, which provided temporary relief. However, the pain returned after using a large drill that twisted his arm. 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 He has been using ice and anti-inflammatory medications like ibuprofen  occasionally to manage the symptoms.  No known allergies to antibiotics.       Medications: Outpatient Medications Prior to Visit  Medication Sig   clotrimazole (LOTRIMIN) 1 % cream Apply 1 Application topically 2 (two) times daily.   colchicine  0.6 MG tablet Take 1 tablet (0.6 mg total) by mouth daily as needed.   ketoconazole  (NIZORAL ) 2 % shampoo Apply 1 Application topically as directed. wash trunk 3 times a week for 2 months, then once a month for maintenance, let sit 5 minutes and rinse off, for Tinea versicolor   fluconazole  (DIFLUCAN ) 150 MG tablet Take 1 tablet Monday, Wednesday, Friday (Patient not taking: Reported on 05/23/2024)   ketoconazole  (NIZORAL ) 2 % cream Apply topically to feet and toes at bedtime daily (Patient not taking: Reported on 05/23/2024)   triamcinolone  cream (KENALOG ) 0.1 % Apply 1 Application topically as directed. Qd up to 5 days per week affected area psoriasis buttocks, elbows, feet, toenails until clear, then prn flares, avoid face, groin, axilla (Patient not taking: Reported on 05/23/2024)   No facility-administered medications prior to visit.   Review of Systems     Objective    BP 117/81 (BP Location: Left Arm, Patient Position: Sitting, Cuff Size: Normal)   Pulse 62   Temp 98.2 F (36.8 C) (Oral)   Ht 5' 6 (1.676 m)   SpO2 97%   BMI 34.70  kg/m   Physical Exam   Mild swelling and inflammation along distal toes of both feet, particularly the great toes. No drainage.  Tender along left medial epicondyle, pain reproduced with pronation against resistance.    Assessment & Plan       Paronychia and ingrown toenails with cellulitis Chronic fungal infection treated with antifungal medication. Toenails growing but causing discomfort, redness, and possible cellulitis due to increased activity and shoe wear. Inflammation and  infection likely due to skin not accustomed to toenails. - Prescribe Cefalexin for 10 days. - Advise daily Epsom salt foot soaks. - Prescribe anti-inflammatory medication. - Instruct to cut toenails straight across.  Medial epicondylitis, left elbow Pain in medial left elbow for 8 weeks, likely medial epicondylitis due to tendon inflammation. Symptoms persist with certain activities. - Prescribe anti-inflammatory medication. - Advise ice packs to elbow for 10 minutes, twice daily. - Recommend avoiding repetitive motions. - Continue range of motion exercises within pain-free limits. - Consider referral to occupational therapy if symptoms persist.         Nancyann Perry, MD  [facility] [phone] (phone) [phone] (fax)  [facility]

## 2024-05-24 ENCOUNTER — Ambulatory Visit (INDEPENDENT_AMBULATORY_CARE_PROVIDER_SITE_OTHER): Admitting: Podiatry

## 2024-05-24 DIAGNOSIS — L600 Ingrowing nail: Secondary | ICD-10-CM

## 2024-05-24 NOTE — Progress Notes (Signed)
 Subjective:  Patient ID: Lance Bennett, male    DOB: Apr 04, 1981,  MRN: [id number]  Chief Complaint  Patient presents with   Nail Problem    43 y.o. male presents with the above complaint.  Patient presents with bilateral hallux medial border ingrown painful to touch is progressing and worse worse with ambulation is with pressure patient would like to discuss treatment options for pain scale 7 out of 10 dull aching nature.  He denies any other acute issues.   Review of Systems: Negative except as noted in the HPI. Denies N/V/F/Ch.  Past Medical History:  Diagnosis Date   No pertinent past medical history    Varicocele     Current Outpatient Medications:    cephALEXin  (KEFLEX ) 500 MG capsule, Take 1 capsule (500 mg total) by mouth 3 (three) times daily., Disp: 20 capsule, Rfl: 0   clotrimazole (LOTRIMIN) 1 % cream, Apply 1 Application topically 2 (two) times daily., Disp: , Rfl:    colchicine  0.6 MG tablet, Take 1 tablet (0.6 mg total) by mouth daily as needed., Disp: 30 tablet, Rfl: 2   fluconazole  (DIFLUCAN ) 150 MG tablet, Take 1 tablet Monday, Wednesday, Friday (Patient not taking: Reported on 05/23/2024), Disp: 12 tablet, Rfl: 0   ketoconazole  (NIZORAL ) 2 % cream, Apply topically to feet and toes at bedtime daily (Patient not taking: Reported on 05/23/2024), Disp: 30 g, Rfl: 11   ketoconazole  (NIZORAL ) 2 % shampoo, Apply 1 Application topically as directed. wash trunk 3 times a week for 2 months, then once a month for maintenance, let sit 5 minutes and rinse off, for Tinea versicolor, Disp: 120 mL, Rfl: 11   naproxen  (NAPROSYN ) 500 MG tablet, Take 1 tablet (500 mg total) by mouth 2 (two) times daily with a meal., Disp: 30 tablet, Rfl: 0   triamcinolone  cream (KENALOG ) 0.1 %, Apply 1 Application topically as directed. Qd up to 5 days per week affected area psoriasis buttocks, elbows, feet, toenails until clear, then prn flares, avoid face, groin, axilla (Patient not taking: Reported on  05/23/2024), Disp: 80 g, Rfl: 2  Social History   Tobacco Use  Smoking Status Never  Smokeless Tobacco Never    No Known Allergies Objective:  There were no vitals filed for this visit. There is no height or weight on file to calculate BMI. Constitutional Well developed. Well nourished.  Vascular Dorsalis pedis pulses palpable bilaterally. Posterior tibial pulses palpable bilaterally. Capillary refill normal to all digits.  No cyanosis or clubbing noted. Pedal hair growth normal.  Neurologic Normal speech. Oriented to person, place, and time. Epicritic sensation to light touch grossly present bilaterally.  Dermatologic Painful ingrowing nail at medial nail borders of the hallux nail bilaterally. No other open wounds. No skin lesions.  Orthopedic: Normal joint ROM without pain or crepitus bilaterally. No visible deformities. No bony tenderness.   Radiographs: None Assessment:   1. Ingrown toenail of right foot   2. Ingrown left big toenail    Plan:  Patient was evaluated and treated and all questions answered.  Ingrown Nail, bilaterally -Patient elects to proceed with minor surgery to remove ingrown toenail removal today. Consent reviewed and signed by patient. -Ingrown nail excised. See procedure note. -Educated on post-procedure care including soaking. Written instructions provided and reviewed. -Patient to follow up in 2 weeks for nail check.  Procedure: Excision of Ingrown Toenail Location: Bilateral 1st toe medial nail borders. Anesthesia: Lidocaine  1% plain; 1.5 mL and Marcaine 0.5% plain; 1.5 mL, digital block. Skin  Prep: Betadine. Dressing: Silvadene; telfa; dry, sterile, compression dressing. Technique: Following skin prep, the toe was exsanguinated and a tourniquet was secured at the base of the toe. The affected nail border was freed, split with a nail splitter, and excised. Chemical matrixectomy was then performed with phenol and irrigated out with alcohol.  The tourniquet was then removed and sterile dressing applied. Disposition: Patient tolerated procedure well. Patient to return in 2 weeks for follow-up.   No follow-ups on file.

## 2024-09-11 ENCOUNTER — Ambulatory Visit (INDEPENDENT_AMBULATORY_CARE_PROVIDER_SITE_OTHER): Admitting: Dermatology

## 2024-09-11 DIAGNOSIS — D1801 Hemangioma of skin and subcutaneous tissue: Secondary | ICD-10-CM | POA: Diagnosis not present

## 2024-09-11 DIAGNOSIS — B351 Tinea unguium: Secondary | ICD-10-CM

## 2024-09-11 DIAGNOSIS — Z872 Personal history of diseases of the skin and subcutaneous tissue: Secondary | ICD-10-CM | POA: Diagnosis not present

## 2024-09-11 DIAGNOSIS — Z7189 Other specified counseling: Secondary | ICD-10-CM

## 2024-09-11 DIAGNOSIS — B360 Pityriasis versicolor: Secondary | ICD-10-CM

## 2024-09-11 DIAGNOSIS — Z79899 Other long term (current) drug therapy: Secondary | ICD-10-CM

## 2024-09-11 MED ORDER — FLUCONAZOLE 150 MG PO TABS
ORAL_TABLET | ORAL | 0 refills | Status: AC
Start: 2024-09-11 — End: ?

## 2024-09-11 MED ORDER — KETOCONAZOLE 2 % EX SHAM
1.0000 | MEDICATED_SHAMPOO | CUTANEOUS | 11 refills | Status: AC
Start: 2024-09-11 — End: ?

## 2024-09-11 NOTE — Patient Instructions (Addendum)

## 2024-09-11 NOTE — Progress Notes (Unsigned)
 Follow-Up Visit   Subjective  Lance Bennett is a 43 y.o. male who presents for the following: hx of warts vs isk at hands, hx of tinea unguium, hx of psoriasis, hx of tinea versicolor at back   Had molecular study showing fungus in toenails. Was started on fluconazole  pill which he took a total of 4 months on treatment and did well   Also some spots at left arm he would like to discuss   The following portions of the chart were reviewed this encounter and updated as appropriate: medications, allergies, medical history  Review of Systems:  No other skin or systemic complaints except as noted in HPI or Assessment and Plan.  Objective  Well appearing patient in no apparent distress; mood and affect are within normal limits.   A focused examination was performed of the following areas: B/l feet, toenails, elbows, buttocks, back, arms  Relevant exam findings are noted in the Assessment and Plan.    Assessment & Plan   History PSORIASIS  May have been exazerbated by fungus and tinea versicolor Feet, toenails, elbows, buttocks Exam: clear today  0 BSA. Chronic condition with duration or expected duration over one year. Currently well-controlled. patient denies joint pain Psoriasis is a chronic non-curable, but treatable genetic/hereditary disease that may have other systemic features affecting other organ systems such as joints (Psoriatic Arthritis). It is associated with an increased risk of inflammatory bowel disease, heart disease, non-alcoholic fatty liver disease, and depression.  Treatments include light and laser treatments; topical medications; and systemic medications including oral and injectables.   Treatment Plan: Do no recommend any treatment at this time    ONYCHOMYCOSIS Exam: Thickened toenails with subungal debris c/w onychomycosis Chronic and persistent condition with duration or expected duration over one year. Condition is symptomatic/ bothersome to patient.  Not currently at goal.  Treatment Plan: Continue for another month -  fluconazole  150 mg capsule by mouth 3 times a week 12 pills total   Side effects of fluconazole  (diflucan ) include nausea, diarrhea, headache, dizziness, taste changes, rare risk of irritation of the liver, allergy, or decreased blood counts (which could show up as infection or tiredness).   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 Currently well-controlled.  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  It is generally asymptomatic. The rash is due to overgrowth of a common type of yeast present on everyone's skin and it is not contagious.  It tends to flare more in the summer due to increased sweating on trunk.  After rash is treated, the scaliness will resolve, but the discoloration will take longer to return to normal pigmentation. The periodic use of an OTC medicated soap/shampoo with zinc  or selenium sulfide can be helpful to prevent yeast overgrowth and recurrence.   Long term medication management.  Patient is using long term (months to years) prescription medication  to control their dermatologic condition.  These medications require periodic monitoring to evaluate for efficacy and side effects and may require periodic laboratory monitoring.    HEMANGIOMA Exam: 4 mm  red papule at left mid dorsum  foreram  Proven under dermatoscope today  Discussed benign nature. Recommend observation. Call for changes.. No treatment recommended  TINEA UNGUIUM   Related Medications ketoconazole  (NIZORAL ) 2 % cream Apply topically to feet and toes at bedtime  daily fluconazole  (DIFLUCAN ) 150 MG tablet Take 1 tablet Monday, Wednesday, Friday TINEA VERSICOLOR   Related Medications ketoconazole  (NIZORAL ) 2 % shampoo Apply 1 Application topically as directed. wash trunk 3 times a week for 2 months, then once a month for maintenance, let sit 5 minutes and rinse off, for Tinea versicolor  Return in about 6 months (around 03/11/2025) for TBSE.  IEleanor Blush, CMA, am acting as scribe for Alm Rhyme, MD.   Documentation: I have reviewed the above documentation for accuracy and completeness, and I agree with the above.  Alm Rhyme, MD

## 2024-09-12 ENCOUNTER — Encounter: Payer: Self-pay | Admitting: Dermatology

## 2024-11-22 ENCOUNTER — Ambulatory Visit (INDEPENDENT_AMBULATORY_CARE_PROVIDER_SITE_OTHER): Payer: Self-pay | Admitting: Family Medicine

## 2024-11-22 ENCOUNTER — Encounter: Payer: Self-pay | Admitting: Family Medicine

## 2024-11-22 VITALS — BP 111/73 | HR 62 | Resp 14 | Ht 69.5 in | Wt 210.8 lb

## 2024-11-22 DIAGNOSIS — R519 Headache, unspecified: Secondary | ICD-10-CM | POA: Diagnosis not present

## 2024-11-22 DIAGNOSIS — E559 Vitamin D deficiency, unspecified: Secondary | ICD-10-CM

## 2024-11-22 DIAGNOSIS — E66811 Obesity, class 1: Secondary | ICD-10-CM

## 2024-11-22 DIAGNOSIS — R7303 Prediabetes: Secondary | ICD-10-CM | POA: Diagnosis not present

## 2024-11-22 DIAGNOSIS — Z0000 Encounter for general adult medical examination without abnormal findings: Secondary | ICD-10-CM | POA: Diagnosis not present

## 2024-11-22 DIAGNOSIS — K900 Celiac disease: Secondary | ICD-10-CM | POA: Diagnosis not present

## 2024-11-22 DIAGNOSIS — B351 Tinea unguium: Secondary | ICD-10-CM

## 2024-11-22 DIAGNOSIS — Z23 Encounter for immunization: Secondary | ICD-10-CM | POA: Diagnosis not present

## 2024-11-22 DIAGNOSIS — Z6830 Body mass index (BMI) 30.0-30.9, adult: Secondary | ICD-10-CM

## 2024-11-22 NOTE — Progress Notes (Signed)
 "    Complete physical exam   Patient: Lance Bennett   DOB: 1981-08-26   44 y.o. Male  MRN: [id number] Visit Date: 11/22/2024  Today's healthcare provider: Jon Eva, MD   Chief Complaint  Patient presents with   Annual Exam    Last Exam Physical: 11/22/23 Diet: eats healthy no specific regimen Exercise: Run 1-2 days a week Feeling: Good Sleeping: Well, 8 hours Discuss: If Diflucan  is save to take periodically as he is taking by Dermatology   Subjective    Lance Bennett is a 44 y.o. male who presents today for a complete physical exam.    Discussed the use of AI scribe software for clinical note transcription with the patient, who gave verbal consent to proceed.  History of Present Illness   Lance Bennett is a 44 year old male who presents for a routine follow-up and management of toenail issues.  He has had toenail problems for over a year. He completed four months of fluconazole  at three doses a week without clear improvement. He has used daily topical Lotrimin for over a year, but three toenails have worsened, and he files them to manage. He has delayed another course of fluconazole  because he wants to review current liver function tests, given prior elevated alkaline phosphatase that he associates with his celiac disease.  He has celiac disease and follows a gluten-free diet without difficulty.  He has reduced sugar intake by cutting the sugar in his sweet tea by half and wants to see how this affects his A1c.       Last depression screening scores    11/22/2024    2:00 PM 05/23/2024    2:30 PM 03/14/2024    1:52 PM  PHQ 2/9 Scores  PHQ - 2 Score 0 0 0  PHQ- 9 Score 0 0  0      Data saved with a previous flowsheet row definition   Last fall risk screening    11/22/2024    2:01 PM  Fall Risk   Falls in the past year? 0  Number falls in past yr: 0  Injury with Fall? 0  Risk for fall due to : No Fall Risks  Follow up Falls evaluation  completed        Medications: Show/hide medication list[1]  Review of Systems    Objective    BP 111/73   Pulse 62   Resp 14   Ht 5' 9.5 (1.765 m)   Wt 210 lb 12.8 oz (95.6 kg)   SpO2 99%   BMI 30.68 kg/m    Physical Exam Vitals reviewed.  Constitutional:      General: He is not in acute distress.    Appearance: Normal appearance. He is well-developed. He is not diaphoretic.  HENT:     Head: Normocephalic and atraumatic.     Right Ear: Tympanic membrane, ear canal and external ear normal.     Left Ear: Tympanic membrane, ear canal and external ear normal.     Nose: Nose normal.     Mouth/Throat:     Mouth: Mucous membranes are moist.     Pharynx: Oropharynx is clear. No oropharyngeal exudate.  Eyes:     General: No scleral icterus.    Conjunctiva/sclera: Conjunctivae normal.     Pupils: Pupils are equal, round, and reactive to light.  Neck:     Thyroid: No thyromegaly.  Cardiovascular:     Rate and Rhythm: Normal rate and regular  rhythm.     Heart sounds: Normal heart sounds. No murmur heard. Pulmonary:     Effort: Pulmonary effort is normal. No respiratory distress.     Breath sounds: Normal breath sounds. No wheezing or rales.  Abdominal:     General: There is no distension.     Palpations: Abdomen is soft.     Tenderness: There is no abdominal tenderness.  Musculoskeletal:        General: No deformity.     Cervical back: Neck supple.     Right lower leg: No edema.     Left lower leg: No edema.  Lymphadenopathy:     Cervical: No cervical adenopathy.  Skin:    General: Skin is warm and dry.     Findings: No rash.  Neurological:     Mental Status: He is alert and oriented to person, place, and time. Mental status is at baseline.     Gait: Gait normal.  Psychiatric:        Mood and Affect: Mood normal.        Behavior: Behavior normal.        Thought Content: Thought content normal.      No results found for any visits on 11/22/24.   Assessment & Plan    Routine Health Maintenance and Physical Exam  Exercise Activities and Dietary recommendations  Goals   None     Immunization History  Administered Date(s) Administered   Hepb-cpg 09/27/2022, 11/04/2022, 03/29/2023   Influenza, Seasonal, Injecte, Preservative Fre 11/22/2023, 11/22/2024   Influenza,inj,Quad PF,6+ Mos 08/26/2020, 08/27/2021   PFIZER(Purple Top)SARS-COV-2 Vaccination 01/10/2020, 02/04/2020, 11/26/2020   Tdap 11/22/2020    Health Maintenance  Topic Date Due   COVID-19 Vaccine (4 - 2025-26 season) 06/25/2024   DTaP/Tdap/Td (2 - Td or Tdap) 11/22/2030   Influenza Vaccine  Completed   Hepatitis B Vaccines 19-59 Average Risk  Completed   HPV VACCINES (No Doses Required) Completed   Hepatitis C Screening  Completed   HIV Screening  Completed   Pneumococcal Vaccine  Aged Out   Meningococcal B Vaccine  Aged Out    Discussed health benefits of physical activity, and encouraged him to engage in regular exercise appropriate for his age and condition.  Problem List Items Addressed This Visit       Digestive   Celiac disease   Relevant Orders   Ambulatory referral to Gastroenterology   TSH   CBC w/Diff/Platelet     Musculoskeletal and Integument   Onychomycosis     Other   Obesity   Relevant Orders   Comprehensive metabolic panel with GFR   Lipid Panel With LDL/HDL Ratio   TSH   CBC w/Diff/Platelet   Bilateral headaches   Prediabetes   Relevant Orders   Hemoglobin A1c   Other Visit Diagnoses       Encounter for annual physical exam    -  Primary   Relevant Orders   Comprehensive metabolic panel with GFR   Lipid Panel With LDL/HDL Ratio   Hemoglobin A1c   TSH   CBC w/Diff/Platelet     Immunization due       Relevant Orders   Flu vaccine trivalent PF, 6mos and older(Flulaval,Afluria,Fluarix,Fluzone) (Completed)     Avitaminosis D       Relevant Orders   VITAMIN D  25 Hydroxy (Vit-D Deficiency, Fractures)            Celiac disease Well-managed with a gluten-free diet. Previous gastroenterologist left, but follow-up with Dr.  Therisa is planned. Liver function tests were slightly elevated in May, possibly related to celiac disease. - Referred to Dr. Therisa for ongoing management of celiac disease. - Ordered liver function tests to monitor before restarting fluconazole . - Continue gluten-free diet.  Onychomycosis Chronic onychomycosis with previous treatment of fluconazole  for four months. Toenails showed improvement but not complete resolution. Currently using Lotrimin daily for over a year. Plan to restart fluconazole  for one month with liver function monitoring due to potential hepatotoxicity. - Ordered liver function tests before restarting fluconazole . - Restart fluconazole  for one month. - Continue daily Lotrimin application.  Vitamin D  deficiency Monitored due to association with celiac disease. - Ordered vitamin D  level.  Prediabetes Managed with dietary modifications, including reducing sweet tea consumption. A1c will be checked to assess current status. - Ordered A1c test.  Headache Intermittent headaches with visual disturbances in December, resolved by January. Occasional eye focus issues likely due to age-related changes. - Continue to monitor for recurrence of headaches or visual disturbances.  General Health Maintenance Up to date on tetanus and hepatitis B vaccinations. Flu vaccine administered today. Colonoscopy screening recommended at age 75 due to celiac disease. Prostate cancer screening to start at age 77. - Administered flu vaccine. - Scheduled colonoscopy at age 4. - Plan prostate cancer screening at age 25.        Return in about 1 year (around 11/22/2025) for CPE.     Jon Eva, MD  [facility] [phone] (phone) [phone] (fax)  [facility]     [1]  Outpatient Medications Prior to Visit  Medication Sig    clotrimazole (LOTRIMIN) 1 % cream Apply 1 Application topically 2 (two) times daily.   colchicine  0.6 MG tablet Take 1 tablet (0.6 mg total) by mouth daily as needed.   fluconazole  (DIFLUCAN ) 150 MG tablet Take 1 tablet Monday, Wednesday, Friday   ketoconazole  (NIZORAL ) 2 % cream Apply topically to feet and toes at bedtime daily   ketoconazole  (NIZORAL ) 2 % shampoo Apply 1 Application topically as directed. wash trunk 3 times a week for 2 months, then once a month for maintenance, let sit 5 minutes and rinse off, for Tinea versicolor   [DISCONTINUED] cephALEXin  (KEFLEX ) 500 MG capsule Take 1 capsule (500 mg total) by mouth 3 (three) times daily.   [DISCONTINUED] naproxen  (NAPROSYN ) 500 MG tablet Take 1 tablet (500 mg total) by mouth 2 (two) times daily with a meal.   [DISCONTINUED] triamcinolone  cream (KENALOG ) 0.1 % Apply 1 Application topically as directed. Qd up to 5 days per week affected area psoriasis buttocks, elbows, feet, toenails until clear, then prn flares, avoid face, groin, axilla (Patient not taking: Reported on 05/23/2024)   No facility-administered medications prior to visit.   "

## 2024-11-23 LAB — COMPREHENSIVE METABOLIC PANEL WITH GFR
ALT: 29 [IU]/L (ref 0–44)
AST: 26 [IU]/L (ref 0–40)
Albumin: 4.7 g/dL (ref 4.1–5.1)
Alkaline Phosphatase: 118 [IU]/L (ref 47–123)
BUN/Creatinine Ratio: 10 (ref 9–20)
BUN: 11 mg/dL (ref 6–24)
Bilirubin Total: 0.4 mg/dL (ref 0.0–1.2)
CO2: 22 mmol/L (ref 20–29)
Calcium: 9.7 mg/dL (ref 8.7–10.2)
Chloride: 100 mmol/L (ref 96–106)
Creatinine, Ser: 1.07 mg/dL (ref 0.76–1.27)
Globulin, Total: 2.9 g/dL (ref 1.5–4.5)
Glucose: 94 mg/dL (ref 70–99)
Potassium: 4 mmol/L (ref 3.5–5.2)
Sodium: 142 mmol/L (ref 134–144)
Total Protein: 7.6 g/dL (ref 6.0–8.5)
eGFR: 89 mL/min/{1.73_m2}

## 2024-11-23 LAB — CBC WITH DIFFERENTIAL/PLATELET
Basophils Absolute: 0.1 10*3/uL (ref 0.0–0.2)
Basos: 1 %
EOS (ABSOLUTE): 0.4 10*3/uL (ref 0.0–0.4)
Eos: 4 %
Hematocrit: 44 % (ref 37.5–51.0)
Hemoglobin: 14.5 g/dL (ref 13.0–17.7)
Immature Grans (Abs): 0 10*3/uL (ref 0.0–0.1)
Immature Granulocytes: 0 %
Lymphocytes Absolute: 2.9 10*3/uL (ref 0.7–3.1)
Lymphs: 27 %
MCH: 27.2 pg (ref 26.6–33.0)
MCHC: 33 g/dL (ref 31.5–35.7)
MCV: 83 fL (ref 79–97)
Monocytes Absolute: 0.8 10*3/uL (ref 0.1–0.9)
Monocytes: 7 %
Neutrophils Absolute: 6.6 10*3/uL (ref 1.4–7.0)
Neutrophils: 61 %
Platelets: 307 10*3/uL (ref 150–450)
RBC: 5.33 x10E6/uL (ref 4.14–5.80)
RDW: 12.7 % (ref 11.6–15.4)
WBC: 10.8 10*3/uL (ref 3.4–10.8)

## 2024-11-23 LAB — LIPID PANEL WITH LDL/HDL RATIO
Cholesterol, Total: 173 mg/dL (ref 100–199)
HDL: 40 mg/dL
LDL Chol Calc (NIH): 106 mg/dL — ABNORMAL HIGH (ref 0–99)
LDL/HDL Ratio: 2.7 ratio (ref 0.0–3.6)
Triglycerides: 151 mg/dL — ABNORMAL HIGH (ref 0–149)
VLDL Cholesterol Cal: 27 mg/dL (ref 5–40)

## 2024-11-23 LAB — HEMOGLOBIN A1C
Est. average glucose Bld gHb Est-mCnc: 120 mg/dL
Hgb A1c MFr Bld: 5.8 % — ABNORMAL HIGH (ref 4.8–5.6)

## 2024-11-23 LAB — VITAMIN D 25 HYDROXY (VIT D DEFICIENCY, FRACTURES): Vit D, 25-Hydroxy: 23.6 ng/mL — ABNORMAL LOW (ref 30.0–100.0)

## 2024-11-23 LAB — TSH: TSH: 5.21 u[IU]/mL — ABNORMAL HIGH (ref 0.450–4.500)

## 2024-11-26 ENCOUNTER — Ambulatory Visit: Payer: Self-pay | Admitting: Family Medicine

## 2024-11-27 ENCOUNTER — Other Ambulatory Visit: Payer: Self-pay

## 2024-11-27 DIAGNOSIS — R7989 Other specified abnormal findings of blood chemistry: Secondary | ICD-10-CM

## 2025-03-06 ENCOUNTER — Ambulatory Visit: Admitting: Dermatology

## 2025-11-26 ENCOUNTER — Encounter: Admitting: Family Medicine
